# Patient Record
Sex: Male | Born: 1945 | Race: White | Hispanic: No | Marital: Married | Smoking: Never smoker
Health system: Southern US, Community
[De-identification: ages and names within clinical notes are randomized; demographics above are authoritative.]

## PROBLEM LIST (undated history)

## (undated) DIAGNOSIS — E785 Hyperlipidemia, unspecified: Secondary | ICD-10-CM

## (undated) DIAGNOSIS — I10 Essential (primary) hypertension: Secondary | ICD-10-CM

## (undated) HISTORY — PX: COLONOSCOPY: SHX174

## (undated) HISTORY — PX: CATARACT EXTRACTION: SUR2

## (undated) HISTORY — DX: Hyperlipidemia, unspecified: E78.5

## (undated) HISTORY — DX: Essential (primary) hypertension: I10

## (undated) HISTORY — PX: OTHER SURGICAL HISTORY: SHX169

---

## 1998-07-05 ENCOUNTER — Ambulatory Visit (HOSPITAL_COMMUNITY): Admission: RE | Admit: 1998-07-05 | Discharge: 1998-07-05 | Payer: Self-pay | Admitting: Specialist

## 1998-10-11 ENCOUNTER — Ambulatory Visit (HOSPITAL_COMMUNITY): Admission: RE | Admit: 1998-10-11 | Discharge: 1998-10-11 | Payer: Self-pay | Admitting: Gastroenterology

## 2004-03-02 ENCOUNTER — Encounter: Payer: Self-pay | Admitting: Internal Medicine

## 2006-02-05 ENCOUNTER — Ambulatory Visit: Payer: Self-pay | Admitting: Internal Medicine

## 2006-02-21 ENCOUNTER — Ambulatory Visit: Payer: Self-pay | Admitting: Internal Medicine

## 2009-07-06 ENCOUNTER — Ambulatory Visit: Payer: Self-pay | Admitting: Internal Medicine

## 2009-07-06 ENCOUNTER — Telehealth (INDEPENDENT_AMBULATORY_CARE_PROVIDER_SITE_OTHER): Payer: Self-pay | Admitting: *Deleted

## 2009-07-06 DIAGNOSIS — E785 Hyperlipidemia, unspecified: Secondary | ICD-10-CM | POA: Insufficient documentation

## 2009-07-06 DIAGNOSIS — I10 Essential (primary) hypertension: Secondary | ICD-10-CM | POA: Insufficient documentation

## 2009-07-06 DIAGNOSIS — R739 Hyperglycemia, unspecified: Secondary | ICD-10-CM | POA: Insufficient documentation

## 2009-07-06 DIAGNOSIS — G4730 Sleep apnea, unspecified: Secondary | ICD-10-CM | POA: Insufficient documentation

## 2009-07-12 ENCOUNTER — Encounter: Payer: Self-pay | Admitting: Internal Medicine

## 2010-06-19 ENCOUNTER — Other Ambulatory Visit: Payer: Self-pay | Admitting: Internal Medicine

## 2010-06-19 ENCOUNTER — Encounter: Payer: Self-pay | Admitting: Internal Medicine

## 2010-06-19 ENCOUNTER — Ambulatory Visit
Admission: RE | Admit: 2010-06-19 | Discharge: 2010-06-19 | Payer: Self-pay | Source: Home / Self Care | Attending: Internal Medicine | Admitting: Internal Medicine

## 2010-06-19 DIAGNOSIS — K219 Gastro-esophageal reflux disease without esophagitis: Secondary | ICD-10-CM | POA: Insufficient documentation

## 2010-06-19 LAB — BASIC METABOLIC PANEL
BUN: 19 mg/dL (ref 6–23)
CO2: 28 mEq/L (ref 19–32)
Calcium: 9.3 mg/dL (ref 8.4–10.5)
Chloride: 106 mEq/L (ref 96–112)
Creatinine, Ser: 1.1 mg/dL (ref 0.4–1.5)
GFR: 68.77 mL/min (ref >60.00)
Glucose, Bld: 110 mg/dL — ABNORMAL HIGH (ref 70–99)
Potassium: 4.5 mEq/L (ref 3.5–5.1)
Sodium: 141 mEq/L (ref 135–145)

## 2010-06-19 LAB — HEPATIC FUNCTION PANEL
ALT: 28 U/L (ref 0–53)
AST: 26 U/L (ref 0–37)
Albumin: 4.1 g/dL (ref 3.5–5.2)
Alkaline Phosphatase: 61 U/L (ref 39–117)
Bilirubin, Direct: 0.1 mg/dL (ref 0.0–0.3)
Total Bilirubin: 0.7 mg/dL (ref 0.3–1.2)
Total Protein: 7.2 g/dL (ref 6.0–8.3)

## 2010-06-19 LAB — CONVERTED CEMR LAB
Cholesterol, target level: 200 mg/dL
HDL goal, serum: 40 mg/dL
LDL Goal: 130 mg/dL

## 2010-06-19 LAB — PSA: PSA: 1.91 ng/mL (ref 0.10–4.00)

## 2010-06-21 ENCOUNTER — Telehealth: Payer: Self-pay | Admitting: Internal Medicine

## 2010-07-10 NOTE — Progress Notes (Signed)
Summary: Order Request  Phone Note Call from Patient Call back at Home Phone [phone]   Caller: Patient Summary of Call: Patient called to say he called [facility] and they need an order for him to get a CPAP.  Called [facility] and spoke with Marisue Humble and she said the will need: Order for Home sleep study first and based on that we will see if he qualifies for any medical equipment(CPAP)  Order placed and faxed./Chrae [facility]  July 06, 2009 11:45 AM

## 2010-07-10 NOTE — Assessment & Plan Note (Signed)
Summary: cpx/alr   Vital Signs:  Patient profile:   65 year old male Height:      69.5 inches Weight:      183.6 pounds BMI:     26.82 Temp:     98.4 degrees F oral Pulse rate:   64 / minute Resp:     14 per minute BP sitting:   138 / 70  (left arm) Cuff size:   large  Vitals Entered By: Shonna Chock (July 06, 2009 8:41 AM) CC: CPX: not fasting, refused EKG, Hypertension Management Comments REVIEWED MED LIST, PATIENT AGREED DOSE AND INSTRUCTION CORRECT    CC:  CPX: not fasting, refused EKG, and Hypertension Management.  History of Present Illness: Jason Key is here for med refills & to discuss possible sleep apnea. His wife has noted apnea & CPAP titration has been effective when tolerated. He has difficulty sleeping supine & "got tangled " in tubing. Survelliance labs @ [facility] 08/2008 (PSA included)  revealed FBS 108 & LDL < 100. Creatinine 1.3-1.4 range.A1c was 5.8% last month  Hypertension History:      He complains of visual symptoms, but denies headache, chest pain, palpitations, dyspnea with exertion, orthopnea, PND, peripheral edema, neurologic problems, syncope, and side effects from treatment.  BP averages 110-120/70s.        Positive major cardiovascular risk factors include male age 37 years old or older, hyperlipidemia, and hypertension.  Negative major cardiovascular risk factors include non-tobacco-user status.     Preventive Screening-Counseling & Management  Alcohol-Tobacco     Smoking Status: never  Caffeine-Diet-Exercise     Does Patient Exercise: yes  Allergies (verified): 1)  ! 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 Eyes:  Floaters monitored by Jason Hazle Quant. ENT:  Denies difficulty swallowing and nasal congestion. CV:  Denies leg cramps with exertion. Resp:  Complains of excessive snoring; denies hypersomnolence and morning headaches. GI:  Complains of indigestion; denies abdominal pain, bloody stools, and dark tarry stools; Some increased dyspepsia, PPI as needed . GU:  Denies nocturia and urinary hesitancy. Derm:  Denies poor wound healing; Seborrheic keratosis scalp lesion . Neuro:  Denies numbness and tingling. Endo:  Denies cold intolerance, excessive hunger, excessive thirst, excessive urination, and heat intolerance.  Physical Exam  General:  well-nourished,in no acute distress; alert,appropriate and cooperative throughout examination Head:  Normocephalic and atraumatic without obvious abnormalities. 3X4 mm ? seborrheic keratosis vs epidermoid inclusion cyst Mouth:  Oral mucosa and oropharynx without lesions or exudates.  Teeth in good repair. Mucocoele of tongue R of midline Neck:  No deformities, masses, or tenderness noted. Lungs:  Normal respiratory effort, chest expands symmetrically. Lungs are clear to auscultation, no crackles or wheezes. Heart:  Normal rate and regular rhythm. S1 and S2 normal without gallop, murmur, click, rub .S4 Abdomen:  Bowel sounds positive,abdomen soft and non-tender without masses, organomegaly or hernias noted. Msk:  No deformity or scoliosis noted of thoracic or lumbar spine.   Pulses:  R and L carotid,radial,dorsalis pedis and posterior tibial pulses are full and equal bilaterally  Extremities:  No clubbing, cyanosis, edema, or deformity noted with normal full range of motion of all joints.   Neurologic:  alert & oriented X3 and DTRs symmetrical and normal.   Skin:  see tongue &  scalp Cervical Nodes:  No lymphadenopathy noted Axillary Nodes:  No palpable lymphadenopathy Psych:  memory intact for recent and remote, normally interactive, and good eye contact.     Impression & Recommendations:  Problem # 1:  HYPERTENSION (ICD-401.9) controlled His updated medication list for this problem includes:    Benicar 20 Mg Tabs (Olmesartan medoxomil) .Marland Kitchen... 1 by mouth once daily  Problem # 2:  HYPERLIPIDEMIA (ICD-272.4)  His updated medication list for this problem includes:    Crestor 10 Mg Tabs (Rosuvastatin calcium) .Marland Kitchen... 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 1 by mouth once daily 2)  Crestor 10 Mg Tabs (Rosuvastatin calcium) .Marland Kitchen.. 1 by mouth at bedtime 3)  Meloxicam 7.5 Mg Tabs (Meloxicam) .Marland Kitchen.. 1 two times a day as needed  Hypertension Assessment/Plan:      The patient's hypertensive risk group is category B: At least one risk factor (excluding diabetes) with no target organ damage.  Today's blood pressure is 138/70.    Patient Instructions: 1)  PSA @ [facility] Lab screen in 08/2009 please. 2)  Schedule a colonoscopy as per [facility] Prescriptions: MELOXICAM 7.5 MG TABS (MELOXICAM) 1 two times a day as needed  #90 x 1   Entered and Authorized by:   Marga Melnick MD   Signed by:   Marga Melnick MD on 07/06/2009   Method used:   Print then Give to Patient   RxID:   [id number] CRESTOR 10 MG TABS (ROSUVASTATIN CALCIUM) 1 by mouth at bedtime  #90 x 3   Entered and Authorized by:   Marga Melnick MD   Signed by:   Marga Melnick MD on 07/06/2009   Method used:   Print then Give to Patient   RxID:   [id number] BENICAR 20 MG TABS (OLMESARTAN MEDOXOMIL) 1 by mouth once daily  #90 x 3   Entered and Authorized by:   Marga Melnick MD   Signed by:   Marga Melnick MD on 07/06/2009   Method used:    Print then Give to Patient   RxID:   [id number]

## 2010-07-12 NOTE — Assessment & Plan Note (Signed)
Summary: 6 month roa//pt will be fasting//lch   Vital Signs:  Patient profile:   65 year old male Height:      69.5 inches Weight:      184.8 pounds BMI:     27.00 Temp:     98.5 degrees F oral Pulse rate:   84 / minute Resp:     14 per minute BP sitting:   130 / 82  (left arm) Cuff size:   large  Vitals Entered By: Shonna Chock CMA (June 19, 2010 8:03 AM) CC: follow-up visit, Heartburn, Lipid Management   CC:  follow-up visit, Heartburn, and Lipid Management.  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                                  Hyperlipidemia Follow-Up:   The patient denies muscle aches, GI upset, abdominal pain, flushing, itching, constipation, diarrhea, and fatigue.  The patient denies the following symptoms: chest pain/pressure, exercise intolerance, dypsnea, palpitations, syncope, and pedal edema.  Compliance with medications ( Crestor 10 mg qod) has been near 100%.  Dietary compliance has been poor.  The patient reports exercising 2-3 X per week.  Adjunctive measures currently used by the patient include ASA and fish oil supplements.      Hypertension Follow-Up:  The patient denies lightheadedness, urinary frequency, and headaches.  Compliance with medications (by patient report) has been near 100%.  Adjunctive measures currently used by the patient include salt restriction.  BP 110-120/< 80.     Heartburn:  The patient reports acid reflux and weight gain of 5#, but denies sour taste in mouth, epigastric pain, and trouble swallowing.  The patient denies the following alarm features: melena, dysphagia, and hematemesis.  Symptoms are worse with alcohol and spicy foods.  The patient has found the following treatments to be effective: a PPI.  No EGD to date;colonoscopy due.  Lipid Management History:      Positive NCEP/ATP III risk factors include male age 61 years old or older and hypertension.  Negative NCEP/ATP  III risk factors include non-diabetic, no family history for ischemic heart disease, non-tobacco-user status, no ASHD (atherosclerotic heart disease), no prior stroke/TIA, no peripheral vascular disease, and no history of aortic aneurysm.     Current Medications (verified): 1)  Benicar 40 Mg Tabs (Olmesartan Medoxomil) .... Daily As Directed 2)  Crestor 20 Mg Tabs (Rosuvastatin Calcium) .... 1/2 By Mouth Daily As Directed 3)  Meloxicam 7.5 Mg Tabs (Meloxicam) .Marland Kitchen.. 1 Two Times A Day As Needed  Allergies: 1)  ! Sulfa  Past History:  Past Medical History: Sleep Apnea, CPAP Rxed Hyperlipidemia: NMR Lipoprofile : 098(1191/478), HDL 38,TG 65. LDL goal= <130, ideally < 295. Framingham Study LDL goal = < 130. 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 No apparent alopecia or balding. Eyes:  OD WNL; prostheis OS Ears:  External ear exam shows no significant lesions or deformities.  Otoscopic  examination reveals clear canals, tympanic membranes are intact bilaterally without bulging, retraction, inflammation or discharge. Hearing is grossly normal bilaterally. Nose:  External nasal examination shows no deformity or inflammation. Nasal mucosa are pink and moist without lesions or exudates. Slight seotal dislocation Mouth:  Oral mucosa and oropharynx without lesions or exudates.  Teeth in good repair. Minimal uvular  erythema.   Neck:  No deformities, masses, or tenderness noted. Lungs:   Normal respiratory effort, chest expands symmetrically. Lungs are clear to auscultation, no crackles or wheezes. Heart:  Normal rate and regular rhythm. S1 and S2 normal without gallop, murmur, click, rub .S4 Abdomen:  Bowel sounds positive,abdomen soft and non-tender without masses, organomegaly or hernias noted. Rectal:  declined Genitalia:  self exams Prostate:  declined Pulses:  R and L carotid,radial,dorsalis pedis and posterior tibial pulses are full and equal bilaterally Extremities:  No clubbing, cyanosis, edema, or deformity noted with normal full range of motion of all joints.   Neurologic:  alert & oriented X3 and DTRs symmetrical and normal.   Skin:  Intact without suspicious lesions or rashes Cervical Nodes:  No lymphadenopathy noted Axillary Nodes:  No palpable lymphadenopathy Psych:  memory intact for recent and remote, normally interactive, and good eye contact.     Impression & Recommendations:  Problem # 1:  PREVENTIVE HEALTH CARE (ICD-V70.0)  Orders: T- * Misc. Laboratory test [phone]) TLB-Hepatic/Liver Function Pnl (80076-HEPATIC) TLB-BMP (Basic Metabolic Panel-BMET) (80048-METABOL) TLB-PSA (Prostate Specific Antigen) (84153-PSA)  Problem # 2:  HYPERTENSION (ICD-401.9) controlled  Problem # 3:  HYPERLIPIDEMIA (ICD-272.4)  His updated medication list for this problem includes:    Crestor 20 Mg Tabs (Rosuvastatin calcium) .Marland Kitchen... 1/2 by mouth daily as directed  Problem # 4:  GERD (ICD-530.81)  His updated medication list for this problem includes:    Ranitidine Hcl 150 Mg Caps (Ranitidine hcl) .Marland Kitchen... 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 Daily as directed 2)  Crestor 20 Mg Tabs (Rosuvastatin calcium) .... 1/2 by mouth daily as directed 3)  Ranitidine Hcl 150 Mg Caps (Ranitidine hcl) .Marland Kitchen.. 1 two times a day pre meals  Lipid Assessment/Plan:       Based on NCEP/ATP III, the patient's risk factor category is "0-1 risk factors".  The patient's lipid goals are as follows: Total cholesterol goal is 200; LDL cholesterol goal is 130; HDL cholesterol goal is 40; Triglyceride goal is 150.    Patient Instructions: 1)  Schedule a colonoscopy  to help detect colon cancer as per  [facility]. 2)  It is important that you exercise regularly at least 20 minutes 5 times a week. If you develop chest pain, have severe difficulty breathing, or feel very tired , stop exercising immediately and seek medical attention. 3)  Take an  81 mg coated Aspirin every day. 4)  Check your Blood Pressure regularly. If it is above: 135/85 ON AVERAGE  you should make an appointment. Prescriptions: RANITIDINE HCL 150 MG CAPS (RANITIDINE HCL) 1 two times a day pre meals  #180 x 3   Entered and Authorized by:   Marga Melnick MD   Signed by:   Marga Melnick MD on 06/19/2010   Method used:   Print then Give to Patient   RxID:   [id number] CRESTOR 20 MG TABS (ROSUVASTATIN CALCIUM) 1/2 by mouth daily as directed  #30 x 5   Entered and  Authorized by:   Marga Melnick MD   Signed by:   Marga Melnick MD on 06/19/2010   Method used:   Print then Give to Patient   RxID:   [id number] BENICAR 40 MG TABS (OLMESARTAN MEDOXOMIL) daily as directed  #90 x 3   Entered and Authorized by:   Marga Melnick MD   Signed by:   Marga Melnick MD on 06/19/2010   Method used:   Print then Give to Patient   RxID:   [id number]    Orders Added: 1)  Est. Patient 40-64 years [99396] 2)  T- * Misc. Laboratory test [99999] 3)  TLB-Hepatic/Liver Function Pnl [80076-HEPATIC] 4)  TLB-BMP (Basic Metabolic Panel-BMET) [80048-METABOL] 5)  TLB-PSA (Prostate Specific Antigen) [66440-HKV]

## 2010-07-12 NOTE — Progress Notes (Signed)
Summary: Crestor rx  Phone Note Call from Patient Call back at Home Phone [phone]   Summary of Call: Patient left message on triage noting that he was given Crestor 20mg  #30. The insurance company will only pay for 30 or 90 day supply. Patient would like #45 faxed to [facility] at [phone].  Patient notified this was done. Initial call taken by: Lucious Groves CMA,  June 21, 2010 10:08 AM    Prescriptions: CRESTOR 20 MG TABS (ROSUVASTATIN CALCIUM) 1/2 by mouth daily as directed  #45 x 2   Entered by:   Lucious Groves CMA   Authorized by:   Marga Melnick MD   Signed by:   Lucious Groves CMA on 06/21/2010   Method used:   Electronically to        [facility]* (retail)       [address]       [place], [place]  [address]       Ph: [phone]       Fax: [phone]   RxID:   [id number]

## 2010-07-17 ENCOUNTER — Telehealth (INDEPENDENT_AMBULATORY_CARE_PROVIDER_SITE_OTHER): Payer: Self-pay | Admitting: *Deleted

## 2010-07-26 NOTE — Progress Notes (Signed)
Summary: [facility]  Phone Note Call from Patient Call back at [facility] Phone [phone]   Summary of Call: Pt called stating that he needed to discuss w/ Dr.Hopper his [facility]. Please advise. Army Fossa CMA  July 17, 2010 8:59 AM   Follow-up for Phone Call        Patient called again because he had not heard back and I informed him an OV would be a faster way to address [facility] lab. Patient then indicated his concern is he got a [employer] and the insurance has already paid their part and the 2000 dollars was dedutced from his healthcare savings and he needed that money for other things.  I informed the patient I will Contact Stan ([facility]) and inform him of this situation and either me or Weyman Croon will contact him back. Patient left cell number [phone] Follow-up by: Shonna Chock CMA,  July 18, 2010 9:23 AM  Additional Follow-up for Phone Call Additional follow up Details #1::        Weyman Croon called me back and left message on voicemail informing me he has handled the situation. Additional Follow-up by: Shonna Chock CMA,  July 18, 2010 1:04 PM

## 2011-07-08 ENCOUNTER — Ambulatory Visit (INDEPENDENT_AMBULATORY_CARE_PROVIDER_SITE_OTHER): Payer: BC Managed Care – PPO | Admitting: Internal Medicine

## 2011-07-08 ENCOUNTER — Encounter: Payer: Self-pay | Admitting: Internal Medicine

## 2011-07-08 VITALS — BP 120/74 | HR 63 | Temp 98.2°F | Resp 12 | Ht 69.0 in | Wt 181.0 lb

## 2011-07-08 DIAGNOSIS — E785 Hyperlipidemia, unspecified: Secondary | ICD-10-CM

## 2011-07-08 MED ORDER — ROSUVASTATIN CALCIUM 10 MG PO TABS: ORAL_TABLET | ORAL | Status: DC

## 2011-07-08 MED ORDER — OLMESARTAN MEDOXOMIL 20 MG PO TABS: 20.0000 mg | ORAL_TABLET | Freq: Every day | ORAL | Status: DC

## 2011-07-08 NOTE — Progress Notes (Signed)
Subjective:    Patient ID: Jason Key, male    DOB: 08-10-1945, 66 y.o.   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 Alert, appropriate and cooperative throughout exam. Head: Normocephalic without obvious abnormalities  Eyes: No corneal or conjunctival inflammation noted of non prosthetic eye.  Ears: External  ear exam reveals no significant lesions or deformities. Canals clear .TMs normal. Hearing is grossly normal bilaterally. Nose: External nasal exam reveals no deformity or inflammation. Nasal mucosa are pink and moist. No lesions or exudates noted.   Mouth: Oral mucosa and oropharynx reveal no lesions or exudates. Teeth in good repair. Neck: No deformities, masses, or tenderness noted. Range of motion &  Thyroid normal Lungs: Normal respiratory effort; chest expands symmetrically. Lungs are clear to auscultation without rales, wheezes, or increased work of breathing. Heart: Normal rate and rhythm. Normal S1 and S2. No gallop, click, or rub. S 4 w/o  murmur. Abdomen: Bowel sounds normal; abdomen soft and nontender. No masses, organomegaly or hernias noted. Genitalia/DRE:  Genital exam is unremarkable. The left lobe of the of the prostate is normal; there is mild enlargement of the right. There is no induration or nodularity present.                                                                                Musculoskeletal/extremities: No deformity or scoliosis noted of  the thoracic or lumbar spine. No clubbing, cyanosis, edema, or deformity noted. Range of motion  normal .Tone & strength  normal.Joints normal. Nail health  good. Vascular: Carotid, radial artery, dorsalis pedis and  posterior tibial pulses are full and equal. No bruits present. Neurologic: Alert and oriented x3. Deep tendon reflexes symmetrical and normal.          Skin: Intact without suspicious lesions or rashes. Lymph: No cervical, axillary, or inguinal lymphadenopathy present. Psych: Mood and affect are normal. Normally interactive                                                                                         Assessment & Plan:  #1 comprehensive physical exam; no acute findings #2 see Problem List with Assessments & Recommendations  #3 he has  an apparent  viral upper respiratory infection; options discussed  #4 mild asymmetric, asymptomatic prostatic hypertrophy. He will consider whether to monitor PSA. Plan: see Orders

## 2011-07-08 NOTE — Patient Instructions (Signed)
Preventive Health Care: Exercise at least 30-45 minutes a day,  3-4 days a week.  Eat a low-fat diet with lots of fruits and vegetables, up to 7-9 servings per day. Consume less than 40 grams of sugar per day from foods & drinks with High Fructose Corn Sugar as #1,2,3 or # 4 on label. Health Care Power of Attorney & Living Will. Complete if not in place ; these place you in charge of your health care decisions.  Please  schedule fasting Labs : BMET,Lipids, hepatic panel, CBC & dif, TSH, PSA. PLEASE BRING THESE INSTRUCTIONS TO FOLLOW UP  LAB APPOINTMENT.This will guarantee correct labs are drawn, eliminating need for repeat blood sampling ( needle sticks ! ). Diagnoses /Codes:V70.0 

## 2011-07-09 ENCOUNTER — Other Ambulatory Visit: Payer: Self-pay | Admitting: Internal Medicine

## 2011-07-09 DIAGNOSIS — Z0000 Encounter for general adult medical examination without abnormal findings: Secondary | ICD-10-CM

## 2011-07-10 ENCOUNTER — Other Ambulatory Visit (INDEPENDENT_AMBULATORY_CARE_PROVIDER_SITE_OTHER): Payer: BC Managed Care – PPO

## 2011-07-10 DIAGNOSIS — Z0000 Encounter for general adult medical examination without abnormal findings: Secondary | ICD-10-CM

## 2011-07-10 LAB — LIPID PANEL
HDL: 39.9 mg/dL (ref >39.00)
LDL Cholesterol: 86 mg/dL (ref 0–99)
Total CHOL/HDL Ratio: 3
Triglycerides: 62 mg/dL (ref 0.0–149.0)

## 2011-07-10 LAB — COMPREHENSIVE METABOLIC PANEL
AST: 25 U/L (ref 0–37)
Alkaline Phosphatase: 51 U/L (ref 39–117)
BUN: 24 mg/dL — ABNORMAL HIGH (ref 6–23)
Calcium: 9.1 mg/dL (ref 8.4–10.5)
Creatinine, Ser: 1.3 mg/dL (ref 0.4–1.5)
Total Bilirubin: 0.4 mg/dL (ref 0.3–1.2)

## 2011-10-23 ENCOUNTER — Encounter: Payer: Self-pay | Admitting: Internal Medicine

## 2011-11-26 ENCOUNTER — Ambulatory Visit (INDEPENDENT_AMBULATORY_CARE_PROVIDER_SITE_OTHER): Payer: BC Managed Care – PPO | Admitting: Sports Medicine

## 2011-11-26 ENCOUNTER — Encounter: Payer: Self-pay | Admitting: Sports Medicine

## 2011-11-26 VITALS — BP 150/94 | HR 66 | Ht 69.0 in | Wt 170.0 lb

## 2011-11-26 DIAGNOSIS — M5412 Radiculopathy, cervical region: Secondary | ICD-10-CM

## 2011-11-26 MED ORDER — PREDNISONE 20 MG PO TABS: 20.0000 mg | ORAL_TABLET | Freq: Three times a day (TID) | ORAL | Status: AC

## 2011-11-26 NOTE — Progress Notes (Signed)
  Subjective:    Patient ID: Jason Key, male    DOB: 10/08/1945, 66 y.o.   MRN: [id number]  HPI  Pt presents to clinic for evaluation of rt medial scapular pain, medial right elbow pain, and numbness to fingers 3-5 on rt hand. He has had a history of some periodic pain in his periscapular area for some years He has not had known disc disease before He thinks this started after doing fairly vigorous workouts in his metabolic activity routine  He realizes now that when his arm hangs down or gets traction on the neck this triggers more of the pain There is not much pain over the muscles themselves or the elbow joint itself In trying to play piano he realized that his right hand was more clumsy than usual and he is right-handed  Review of Systems     Objective:   Physical Exam No acute distress No pain with neck extension  Rt lateral neck bend had pain in rt elbow Spurling's positive on the right Weaknesss on flexion of 5 th digit right Weak on inner osseous squeeze right hand  No pain with abduction and elevation of arms bilat Scapular movement is normal bilat C5-6 testing normal Extension of elbow slightly weak Triceps stronger on Lt than rt Biceps reflexes normal bilat Triceps reflex normal           Assessment & Plan:

## 2011-11-26 NOTE — Patient Instructions (Signed)
Avoid weight lifting for the next 2 weeks  Avoid overhead lifting or anything that jars neck  Try stationary bike or treadmill for cardio exercise  Do easy ROM with neck a few times per day  Heat may help discomfort more than cold  Please follow up in 2 weeks if you are not improved  Thank you for seeing [place] today!

## 2011-11-26 NOTE — Assessment & Plan Note (Signed)
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

## 2011-12-11 ENCOUNTER — Other Ambulatory Visit: Payer: Self-pay | Admitting: *Deleted

## 2011-12-11 MED ORDER — TRAMADOL HCL 50 MG PO TABS: ORAL_TABLET | ORAL | Status: DC

## 2011-12-11 MED ORDER — GABAPENTIN 300 MG PO CAPS: 300.0000 mg | ORAL_CAPSULE | Freq: Three times a day (TID) | ORAL | Status: DC

## 2012-01-03 ENCOUNTER — Telehealth: Payer: Self-pay | Admitting: Internal Medicine

## 2012-01-03 MED ORDER — LOSARTAN POTASSIUM 100 MG PO TABS: 100.0000 mg | ORAL_TABLET | Freq: Every day | ORAL | Status: DC

## 2012-01-03 NOTE — Telephone Encounter (Signed)
Rx sent 

## 2012-01-03 NOTE — Telephone Encounter (Signed)
Pt states Benicar is too expensive. I spoke with Dr. Alwyn Ren and he gave a verbal to change to Losartan 100mg  1x day. Pt uses [facility] on [address]

## 2012-04-02 ENCOUNTER — Other Ambulatory Visit: Payer: Self-pay | Admitting: Internal Medicine

## 2012-04-02 NOTE — Telephone Encounter (Signed)
Spoke with patient, patient stated Dr.Hopper rx'ed medication. Patient states he takes it infrequently.  Dr.Hopper please advise since medication is not on medication list

## 2012-04-02 NOTE — Telephone Encounter (Signed)
#   180  , bid prn

## 2012-04-02 NOTE — Telephone Encounter (Signed)
Left message on voicemail for patient to return call when available, reason for call: medication requested is not on med list.

## 2012-04-28 ENCOUNTER — Other Ambulatory Visit: Payer: Self-pay | Admitting: Internal Medicine

## 2012-04-28 ENCOUNTER — Ambulatory Visit (INDEPENDENT_AMBULATORY_CARE_PROVIDER_SITE_OTHER): Payer: BC Managed Care – PPO | Admitting: Sports Medicine

## 2012-04-28 VITALS — BP 140/70 | Ht 69.0 in | Wt 167.0 lb

## 2012-04-28 DIAGNOSIS — M720 Palmar fascial fibromatosis [Dupuytren]: Secondary | ICD-10-CM | POA: Insufficient documentation

## 2012-04-28 NOTE — Progress Notes (Signed)
Subjective:     Patient ID: Jason Key, male   DOB: [birth year], 66 y.o.   MRN: [id number]  HPI 66 yo M established patient presents for follow up visit to discuss a new problem:   1. Nodule on the volar aspect of right hand proximal to metacarpal head. Non tender. Associated with flexion of finger in AM and moderate pain. Finger flexion is reduced with stretching.   2. Nodule L foot on the medial plantar surface. Non tender.   Family History: positive for similar nodules in his mother  Review of Systems As per HPI     Objective:   Physical Exam BP 140/70  Ht 5\' 9"  (1.753 m)  Wt 167 lb (75.751 kg)  BMI 24.66 kg/m2 General appearance: alert, cooperative and no distress Resp: normal work of breathing MSK: R hand: Small palpable nodule right palm immediately proximal to metacarpal head. Non tender. Full ROM in digit. Full strength.  Note left hand has similar changes just less enlarged L foot: two palpable, non tender nodules medial plantar foot. Largest 1.5x1 cm.      Assessment:

## 2012-04-28 NOTE — Assessment & Plan Note (Addendum)
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  If foot nodule becomes painful, cut out the underlying area of shoe insole to support the nodule while removing pressure.   We can try injection therapy if they become entrapped or if too painful  Reck this prn

## 2012-07-31 ENCOUNTER — Other Ambulatory Visit: Payer: Self-pay | Admitting: Internal Medicine

## 2012-11-09 ENCOUNTER — Other Ambulatory Visit: Payer: Self-pay | Admitting: Internal Medicine

## 2012-11-20 ENCOUNTER — Encounter: Payer: Self-pay | Admitting: Internal Medicine

## 2012-11-20 ENCOUNTER — Ambulatory Visit (INDEPENDENT_AMBULATORY_CARE_PROVIDER_SITE_OTHER): Payer: BC Managed Care – PPO | Admitting: Internal Medicine

## 2012-11-20 VITALS — BP 118/78 | HR 67 | Wt 179.0 lb

## 2012-11-20 DIAGNOSIS — Z0000 Encounter for general adult medical examination without abnormal findings: Secondary | ICD-10-CM

## 2012-11-20 DIAGNOSIS — E785 Hyperlipidemia, unspecified: Secondary | ICD-10-CM

## 2012-11-20 MED ORDER — ATORVASTATIN CALCIUM 20 MG PO TABS: ORAL_TABLET | ORAL | Status: DC

## 2012-11-20 MED ORDER — RANITIDINE HCL 150 MG PO TABS: 150.0000 mg | ORAL_TABLET | Freq: Two times a day (BID) | ORAL | Status: DC

## 2012-11-20 MED ORDER — LOSARTAN POTASSIUM 100 MG PO TABS: ORAL_TABLET | ORAL | Status: DC

## 2012-11-20 NOTE — Assessment & Plan Note (Signed)
He will start generic atorvastatin 20 mg one half pill 2-3 times per week. Followup lipids would be recommended after 3-4 months of this regimen.

## 2012-11-20 NOTE — Patient Instructions (Addendum)
Health Care Power of Attorney & Living Will. Complete these if not in place ; these place you in charge of your health care decisions. Please  schedule fasting Labs in 12 weeks after med changes: Lipids, hepatic panel. PLEASE BRING THESE INSTRUCTIONS TO FOLLOW UP  LAB APPOINTMENT.This will guarantee correct labs are drawn, eliminating need for repeat blood sampling ( needle sticks ! ). Diagnoses /Codes: 272.4,995.20.   If you activate the  My Chart system; lab & Xray results will be released directly  to you as soon as I review & address these through the computer. If you choose not to sign up for My Chart within 36 hours of labs being drawn; results will be reviewed & interpretation added before being copied & mailed, causing a delay in getting the results to you.If you do not receive that report within 7-10 days ,please call. Additionally you can use this system to gain direct  access to your records  if  out of town or @ an office of a  physician who is not in  the My Chart network.  This improves continuity of care & places you in control of your medical record.

## 2012-11-20 NOTE — Progress Notes (Signed)
  Subjective:    Patient ID: Jason Key, male    DOB: 1946-05-31, 66 y.o.   MRN: [id number]  HPI [name] is here for a physical & med refills.    Review of Systems He is on a heart healthy diet; he exercises 60 minutes 2-3 times per week without symptoms. Specifically he denies chest pain, palpitations, dyspnea, or claudication. Family history is negative for premature coronary disease. Advanced cholesterol testing reveals his LDL goal is less than 125, ideally < 95. Labs done 09/03/12 were reviewed.  LDL is excellent at 105; HDL 52. GFR is minimally reduced at 58. PSA is 2.3 on; serial PSAs were reviewed. There's been no significant increase in the velocity of rise. BP averages 110-120/< 80 @ home.     Objective:   Physical Exam Gen.: Healthy and well-nourished in appearance. Alert, appropriate and cooperative throughout exam. Head: Normocephalic without obvious abnormalities  Eyes: No corneal or conjunctival inflammation noted. Prosthesis OS. Ears: External  ear exam reveals no significant lesions or deformities. Canals clear .TMs normal. Hearing is grossly normal bilaterally. Nose: External nasal exam reveals no deformity or inflammation. Nasal mucosa are pink and moist. No lesions or exudates noted.   Mouth: Oral mucosa and oropharynx reveal no lesions or exudates. Teeth in good repair. Neck: No deformities, masses, or tenderness noted. Range of motion normal. Thyroid small w/o nodules. Lungs: Normal respiratory effort; chest expands symmetrically. Lungs are clear to auscultation without rales, wheezes, or increased work of breathing. Heart: Normal rate and rhythm. Normal S1 and S2. No gallop, click, or rub. S4 w/o murmur. Abdomen: Bowel sounds normal; abdomen soft and nontender. No masses, organomegaly or hernias noted. Prostate not done; colonoscopy in 2013  Musculoskeletal/extremities: There is minor asymmetry of the lower thoracic musculature suggesting occult scoliosis. No  clubbing, cyanosis, edema, or significant extremity  deformity noted. Range of motion normal .Tone & strength  Normal. Crepitus of knees Joints normal otherwise normal. Nail health good. Able to lie down & sit up w/o help. Negative SLR bilaterally Vascular: Carotid, radial artery, dorsalis pedis and  posterior tibial pulses are full and equal. No bruits present. Neurologic: Alert and oriented x3. Deep tendon reflexes symmetrical and normal.      Skin: Intact without suspicious lesions or rashes. Lymph: No cervical, axillary lymphadenopathy present. Psych: Mood and affect are normal. Normally interactive                                                                                       Assessment & Plan:  #1 comprehensive physical exam; no acute findings  Plan: see Orders  & Recommendations

## 2012-11-30 ENCOUNTER — Encounter: Payer: Self-pay | Admitting: Internal Medicine

## 2012-12-21 ENCOUNTER — Telehealth: Payer: Self-pay | Admitting: *Deleted

## 2012-12-21 NOTE — Telephone Encounter (Signed)
Received call regarding patient sleep study in 2010. Results were faxed on Friday for this patient but she states they needed an AHI level and an O2 saturation from this sleep study. Please contact Gina at [phone] ext [phone]

## 2012-12-23 NOTE — Telephone Encounter (Signed)
Called provided number, spoke with Elita Quick. Faxed Home Sleep Study Report from 07/17/2009 (obtained from [facility]) to [phone]

## 2013-05-28 ENCOUNTER — Other Ambulatory Visit: Payer: Self-pay | Admitting: *Deleted

## 2013-05-28 MED ORDER — ATORVASTATIN CALCIUM 20 MG PO TABS: ORAL_TABLET | ORAL | Status: DC

## 2013-06-29 ENCOUNTER — Other Ambulatory Visit: Payer: Self-pay | Admitting: *Deleted

## 2013-06-29 MED ORDER — ATORVASTATIN CALCIUM 20 MG PO TABS: ORAL_TABLET | ORAL | Status: DC

## 2013-08-13 ENCOUNTER — Other Ambulatory Visit: Payer: Self-pay

## 2013-08-13 MED ORDER — ATORVASTATIN CALCIUM 20 MG PO TABS: ORAL_TABLET | ORAL | Status: DC

## 2013-09-21 ENCOUNTER — Ambulatory Visit (INDEPENDENT_AMBULATORY_CARE_PROVIDER_SITE_OTHER): Payer: Managed Care, Other (non HMO) | Admitting: Sports Medicine

## 2013-09-21 ENCOUNTER — Encounter: Payer: Self-pay | Admitting: Sports Medicine

## 2013-09-21 VITALS — BP 147/92 | Ht 69.0 in | Wt 170.0 lb

## 2013-09-21 DIAGNOSIS — M75101 Unspecified rotator cuff tear or rupture of right shoulder, not specified as traumatic: Secondary | ICD-10-CM | POA: Insufficient documentation

## 2013-09-21 DIAGNOSIS — M25519 Pain in unspecified shoulder: Secondary | ICD-10-CM | POA: Insufficient documentation

## 2013-09-21 DIAGNOSIS — S43429A Sprain of unspecified rotator cuff capsule, initial encounter: Secondary | ICD-10-CM

## 2013-09-21 MED ORDER — NITROGLYCERIN 0.2 MG/HR TD PT24: MEDICATED_PATCH | TRANSDERMAL | Status: DC

## 2013-09-21 NOTE — Assessment & Plan Note (Signed)
NTG protocol  RC rehab series  REck 6 wks

## 2013-09-21 NOTE — Assessment & Plan Note (Signed)
We will suggest using Aleve at therapeutic dose  See response to NTG  If pain worsens consider CSI

## 2013-09-21 NOTE — Progress Notes (Signed)
   Subjective:    Patient ID: Jason Key, male    DOB: 01/18/1946, 68 y.o.   MRN: [id number]  HPI  Pt presents to clinic for evaluation of right shoulder pain x 6 months.  Wakes him at night several times. Pain with reaching backwards.  Does metabolic resistance training at [employer]- kettle bells, renegade rows, rope swings  He does most weight lifting exercises without pain Certain lifts or external rotation exercises cause pain in upper RT humerus  Night time pain is significant and only gets some relief with Aleve  Retired physician     Review of Systems     Objective:   Physical Exam NAD BP 147/92  Ht 5\' 9"  (1.753 m)  Wt 170 lb (77.111 kg)  BMI 25.09 kg/m2  RT shoulder Speed's and Yergason's normal Full ROM  Strong ER and IR at 90 degrees, but has pain with ER at 90 deg elevation Positive empty can and hawkins Neer's negative O'Brien's negative Cross over neg  Lt shoulder normal  Neck motion is OK and scapular function normal  US The supraspinatus has a 1.7 cm retracted tear at bursal surface of anterior fibers Mid fibers show a smaller tear - 0.5 cm Post fibers intact  Subscap/ Ter Minor and Infraspin are all normal Biceps norm AC joint normal      Assessment & Plan:

## 2013-09-21 NOTE — Patient Instructions (Signed)
Please do suggested shoulder exercises daily  Start using 1/4 patch of nitroglycerin on right shoulder daily  Nitroglycerin Protocol   Apply 1/4 nitroglycerin patch to affected area daily.  Change position of patch within the affected area every 24 hours.  You may experience a headache during the first 1-2 weeks of using the patch, these should subside.  If you experience headaches after beginning nitroglycerin patch treatment, you may take your preferred over the counter pain reliever.  Another side effect of the nitroglycerin patch is skin irritation or rash related to patch adhesive.  Please notify our office if you develop more severe headaches or rash, and stop the patch.  Tendon healing with nitroglycerin patch may require 12 to 24 weeks depending on the extent of injury.  Men should not use if taking Viagra, Cialis, or Levitra.   Do not use if you have migraines or rosacea.   Please follow up in 6 weeks  Thank you for seeing us today!

## 2013-09-28 ENCOUNTER — Ambulatory Visit: Payer: BC Managed Care – PPO | Admitting: Sports Medicine

## 2013-11-02 ENCOUNTER — Encounter: Payer: Self-pay | Admitting: Sports Medicine

## 2013-11-02 ENCOUNTER — Ambulatory Visit (INDEPENDENT_AMBULATORY_CARE_PROVIDER_SITE_OTHER): Payer: Managed Care, Other (non HMO) | Admitting: Sports Medicine

## 2013-11-02 VITALS — BP 135/89 | Ht 69.0 in | Wt 170.0 lb

## 2013-11-02 DIAGNOSIS — M75101 Unspecified rotator cuff tear or rupture of right shoulder, not specified as traumatic: Secondary | ICD-10-CM

## 2013-11-02 DIAGNOSIS — S43429A Sprain of unspecified rotator cuff capsule, initial encounter: Secondary | ICD-10-CM

## 2013-11-02 NOTE — Assessment & Plan Note (Signed)
This is dramatically improved on exam and [place] Minimal sings of tear noted now Maximum width is < 0.5 cm  Plan keep up NTG protocol for another 6 wks/ OK to stay at half patch  Keep up HEP and gradually increase weight  Reck and repeat scan 6 wks

## 2013-11-02 NOTE — Progress Notes (Signed)
Patient ID: Jason Key, male   DOB: 1945/06/14, 68 y.o.   MRN: [id number]  Patient with a retracted 1.7 cm supraspinatus tear and night pain 5 weeks ago started on NTG and HEP w light weight Able to do exercises w 3 lbs no pain Night pain has resolved over past 2 weeks Back doing multiple lifts with weights but avoiding too aggressive elevation or ER of shoulder  Comes for repeat evaluation  Rest pain is 0  PEXAM Muscular male NAD BP 135/89  Ht 5\' 9"  (1.753 m)  Wt 170 lb (77.111 kg)  BMI 25.09 kg/m2  Full ROM of RT shoulder Strong IR and ER No pain or weakness on Hawkins or empty can No cross over pain Neg Obrien's  [place] The area of previous retraction has almost resolved The supraspinatus shows only minimal hypoechoic change at distal insertion Area of calcification at prior area of tear Subscap/ Infrsp/ TM are all normal Biceps tendon normal

## 2013-11-03 ENCOUNTER — Other Ambulatory Visit: Payer: Managed Care, Other (non HMO) | Admitting: Sports Medicine

## 2013-12-11 ENCOUNTER — Other Ambulatory Visit: Payer: Self-pay | Admitting: Internal Medicine

## 2013-12-15 ENCOUNTER — Other Ambulatory Visit: Payer: Self-pay | Admitting: Internal Medicine

## 2013-12-16 ENCOUNTER — Other Ambulatory Visit: Payer: Managed Care, Other (non HMO) | Admitting: Sports Medicine

## 2013-12-28 ENCOUNTER — Ambulatory Visit (INDEPENDENT_AMBULATORY_CARE_PROVIDER_SITE_OTHER): Payer: Managed Care, Other (non HMO) | Admitting: Sports Medicine

## 2013-12-28 ENCOUNTER — Encounter: Payer: Self-pay | Admitting: Sports Medicine

## 2013-12-28 VITALS — BP 136/81 | HR 64 | Ht 69.0 in | Wt 175.0 lb

## 2013-12-28 DIAGNOSIS — S43429A Sprain of unspecified rotator cuff capsule, initial encounter: Secondary | ICD-10-CM

## 2013-12-28 DIAGNOSIS — M75101 Unspecified rotator cuff tear or rupture of right shoulder, not specified as traumatic: Secondary | ICD-10-CM

## 2013-12-28 MED ORDER — NITROGLYCERIN 0.2 MG/HR TD PT24: MEDICATED_PATCH | TRANSDERMAL | Status: DC

## 2013-12-28 NOTE — Patient Instructions (Signed)

## 2013-12-28 NOTE — Progress Notes (Signed)
  Jacalyn LefevreStephen Hausmann - 68 y.o. male MRN [id number]  Date of birth: 06/11/1945    SUBJECTIVE:     68 year old male presents for followup regarding rotator cuff tear (supraspinatus).   He reports that he is doing well. He states that he does not have pain at rest and only occasionally has pain with certain activities (reaching back, lawn mower pulls, dips, etc). He has now stopped his nitroglycerin patches; he is been off for the past 2 weeks.  He states that he feels like he is 90% improved but not quite normal.    ROS:     Per HPI  PERTINENT  PMH / PSH/ / SH:  Past Medical, Surgical, Social hx Reviewed.   OBJECTIVE: BP 136/81  Pulse 64  Ht 5\' 9"  (1.753 m)  Wt 175 lb (79.379 kg)  BMI 25.83 kg/m2  Physical Exam:  Vital signs are reviewed. General: well appearing gentleman in NAD.  Shoulder: Inspection reveals no abnormalities, atrophy or asymmetry. Palpation is normal with no tenderness over AC joint or bicipital groove. ROM is full in all planes. Rotator cuff strength normal throughout. No signs of impingement with negative Neer and Hawkin's tests, empty can. No labral pathology noted with negative Obrien's, negative clunk and good stability. Normal scapular function observed. No painful arc and no drop arm sign.  MSK US Limited - Right Shoulder Small tear in the supraspinatus (anteriorly) much improved from prior (1.7 cm >> 0.4 cm). Middle and Posterior fibers normal. Area of calcification at prior area of tear  Subscap/ Infrsp/ TM - Normal Biceps tendon normal  ASSESSMENT & PLAN: See problem based charting & AVS for pt instructions.

## 2013-12-28 NOTE — Assessment & Plan Note (Addendum)
Slowly improving. Tear of supraspinatus improving, now 0.4 cm. Continue Nitroglycerin daily to promote blood flow/healing. Keep up home exercise program Avoid any exercises that cause more than mild pain Follow up in ~ 4-6 weeks.

## 2014-01-13 ENCOUNTER — Other Ambulatory Visit: Payer: Self-pay

## 2014-01-13 MED ORDER — RANITIDINE HCL 150 MG PO TABS: 150.0000 mg | ORAL_TABLET | Freq: Two times a day (BID) | ORAL | Status: DC

## 2014-03-14 ENCOUNTER — Other Ambulatory Visit (INDEPENDENT_AMBULATORY_CARE_PROVIDER_SITE_OTHER): Payer: Medicare HMO

## 2014-03-14 ENCOUNTER — Other Ambulatory Visit: Payer: Self-pay | Admitting: Internal Medicine

## 2014-03-14 ENCOUNTER — Ambulatory Visit (INDEPENDENT_AMBULATORY_CARE_PROVIDER_SITE_OTHER): Payer: Medicare HMO | Admitting: Internal Medicine

## 2014-03-14 ENCOUNTER — Encounter: Payer: Self-pay | Admitting: Internal Medicine

## 2014-03-14 VITALS — BP 138/86 | HR 64 | Temp 98.0°F | Resp 13 | Wt 183.1 lb

## 2014-03-14 DIAGNOSIS — R739 Hyperglycemia, unspecified: Secondary | ICD-10-CM

## 2014-03-14 DIAGNOSIS — N429 Disorder of prostate, unspecified: Secondary | ICD-10-CM

## 2014-03-14 DIAGNOSIS — K219 Gastro-esophageal reflux disease without esophagitis: Secondary | ICD-10-CM

## 2014-03-14 DIAGNOSIS — I10 Essential (primary) hypertension: Secondary | ICD-10-CM

## 2014-03-14 DIAGNOSIS — E785 Hyperlipidemia, unspecified: Secondary | ICD-10-CM

## 2014-03-14 LAB — CBC WITH DIFFERENTIAL/PLATELET
BASOS ABS: 0 10*3/uL (ref 0.0–0.1)
BASOS PCT: 0.3 % (ref 0.0–3.0)
Eosinophils Absolute: 0.2 10*3/uL (ref 0.0–0.7)
Eosinophils Relative: 2.4 % (ref 0.0–5.0)
HEMATOCRIT: 44.5 % (ref 39.0–52.0)
HEMOGLOBIN: 14.9 g/dL (ref 13.0–17.0)
Lymphocytes Relative: 31 % (ref 12.0–46.0)
Lymphs Abs: 2.4 10*3/uL (ref 0.7–4.0)
MCHC: 33.5 g/dL (ref 30.0–36.0)
MCV: 91.9 fl (ref 78.0–100.0)
MONOS PCT: 10.9 % (ref 3.0–12.0)
Monocytes Absolute: 0.8 10*3/uL (ref 0.1–1.0)
NEUTROS ABS: 4.2 10*3/uL (ref 1.4–7.7)
Neutrophils Relative %: 55.4 % (ref 43.0–77.0)
Platelets: 224 10*3/uL (ref 150.0–400.0)
RBC: 4.84 Mil/uL (ref 4.22–5.81)
RDW: 13.8 % (ref 11.5–15.5)
WBC: 7.6 10*3/uL (ref 4.0–10.5)

## 2014-03-14 LAB — HEPATIC FUNCTION PANEL
ALT: 24 U/L (ref 0–53)
AST: 23 U/L (ref 0–37)
Albumin: 4.1 g/dL (ref 3.5–5.2)
Alkaline Phosphatase: 75 U/L (ref 39–117)
Bilirubin, Direct: 0.1 mg/dL (ref 0.0–0.3)
TOTAL PROTEIN: 7.3 g/dL (ref 6.0–8.3)
Total Bilirubin: 0.8 mg/dL (ref 0.2–1.2)

## 2014-03-14 LAB — BASIC METABOLIC PANEL
BUN: 19 mg/dL (ref 6–23)
CALCIUM: 9.1 mg/dL (ref 8.4–10.5)
CO2: 29 mEq/L (ref 19–32)
Chloride: 102 mEq/L (ref 96–112)
Creatinine, Ser: 1.2 mg/dL (ref 0.4–1.5)
GFR: 64.07 mL/min (ref >60.00)
GLUCOSE: 100 mg/dL — AB (ref 70–99)
POTASSIUM: 4.4 meq/L (ref 3.5–5.1)
Sodium: 138 mEq/L (ref 135–145)

## 2014-03-14 LAB — PSA: PSA: 11.66 ng/mL — AB (ref 0.10–4.00)

## 2014-03-14 LAB — HEMOGLOBIN A1C: Hgb A1c MFr Bld: 5.9 % (ref 4.6–6.5)

## 2014-03-14 LAB — TSH: TSH: 2.79 u[IU]/mL (ref 0.35–4.50)

## 2014-03-14 MED ORDER — ATORVASTATIN CALCIUM 20 MG PO TABS: ORAL_TABLET | ORAL | Status: DC

## 2014-03-14 MED ORDER — LOSARTAN POTASSIUM 100 MG PO TABS: ORAL_TABLET | ORAL | Status: DC

## 2014-03-14 NOTE — Assessment & Plan Note (Signed)
A1c

## 2014-03-14 NOTE — Assessment & Plan Note (Signed)
Lipoprofile LFTs

## 2014-03-14 NOTE — Assessment & Plan Note (Signed)
PSA

## 2014-03-14 NOTE — Progress Notes (Signed)
   Subjective:    Patient ID: Jason Key, male    DOB: 04/25/1946, 68 y.o.   MRN: [id number]  HPI  He  is here to assess status of active health conditions:  Diet/ nutrition: Heart healthy diet - modified DASH Exercise program: 2-3 times per week, tennis  HYPERTENSION: Disease Monitoring: does not check blood pressure at home Medication Compliance: reports compliance with current therapy.  No adverse effects.   FASTING HYPERGLYCEMIA  :  Does not check blood sugars at home.  Ophthamology care:UTD Podiatry care: none  HYPERLIPIDEMIA: Disease Monitoring: last 08/2012 Medication Compliance: reports compliance with current therapy as Atorvastatin 2 X /week.  No adverse effects.     Review of Systems    Sleep apnea being treated with mask. All below NEGATIVE: Chest pain, palpitations       Dyspnea Edema Claudication  Lightheadedness,Syncope Weight change Polyuria/phagia/dipsia    Blurred vision /diplopia/lossof vision Limb numbness/tingling/burning Non healing skin lesions Abd pain, bowel changes   Myalgias       Objective:   Physical Exam Gen.: Healthy and well-nourished in appearance. Alert, appropriate and cooperative throughout exam.  Head: Normocephalic without obvious abnormalities; pattern alopecia  Eyes: OS prosthesis Ears: External  ear exam reveals no significant lesions or deformities. Canals clear .TMs normal. Hearing is grossly normal bilaterally. Nose: External nasal exam reveals no deformity or inflammation. Nasal mucosa are pink and moist. No lesions or exudates noted.   Mouth: Oral mucosa and oropharynx reveal no lesions or exudates. Teeth in good repair. Neck: No deformities, masses, or tenderness noted. Range of motion &. Thyroid normal Lungs: Normal respiratory effort; chest expands symmetrically. Lungs are clear to auscultation without rales, wheezes, or increased work of breathing. Heart: Normal rate and rhythm. Normal S1 and S2. No gallop, click,  or rub. No murmur. Abdomen: Bowel sounds normal; abdomen soft and nontender. No masses, organomegaly or hernias noted. Genitalia: Genitalia normal except for left varices. Prostate asymmetric; L lobe > R w/o nodularity or induration                                Musculoskeletal/extremities: No deformity or scoliosis noted of  the thoracic or lumbar spine.  No clubbing, cyanosis, edema, or significant extremity  deformity noted. Range of motion normal .Tone & strength normal. Hand joints normal Fingernail  health good. Able to lie down & sit up w/o help. Negative SLR bilaterally Vascular: Carotid, radial artery, dorsalis pedis and  posterior tibial pulses are full and equal. No bruits present. Neurologic: Alert and oriented x3. Deep tendon reflexes symmetrical and normal.  Gait normal.  Skin: Intact without suspicious lesions or rashes. Pattern contact dermatitis from CPAP mask Lymph: No cervical, axillary, or inguinal lymphadenopathy present. Psych: Mood and affect are normal. Normally interactive                                                                                        Assessment & Plan:  See Current Assessment & Plan in Problem List under specific Diagnosis

## 2014-03-14 NOTE — Progress Notes (Signed)
Pre visit review using our clinic review tool, if applicable. No additional management support is needed unless otherwise documented below in the visit note. 

## 2014-03-14 NOTE — Assessment & Plan Note (Signed)
CBC GI referral declined

## 2014-03-14 NOTE — Assessment & Plan Note (Signed)
Blood pressure goals reviewed. BMET 

## 2014-03-14 NOTE — Patient Instructions (Signed)
Reflux of gastric acid may be asymptomatic as this may occur mainly during sleep.The triggers for reflux  include stress; the "aspirin family" ; alcohol; peppermint; and caffeine (coffee, tea, cola, and chocolate). The aspirin family would include aspirin and the nonsteroidal agents such as ibuprofen &  Naproxen. Tylenol would not cause reflux. If having symptoms ; food & drink should be avoided for @ least 2 hours before going to bed.    Minimal Blood Pressure Goal= AVERAGE < 140/90;  Ideal is an AVERAGE < 135/85. This AVERAGE should be calculated from @ least 5-7 BP readings taken @ different times of day on different days of week. You should not respond to isolated BP readings , but rather the AVERAGE for that week .Please bring your  blood pressure cuff to office visits to verify that it is reliable.It  can also be checked against the blood pressure device at the pharmacy. Finger or wrist cuffs are not dependable; an arm cuff is. 

## 2014-03-15 ENCOUNTER — Telehealth: Payer: Self-pay

## 2014-03-15 ENCOUNTER — Telehealth: Payer: Self-pay | Admitting: Internal Medicine

## 2014-03-15 ENCOUNTER — Other Ambulatory Visit: Payer: Medicare HMO

## 2014-03-15 ENCOUNTER — Other Ambulatory Visit: Payer: Self-pay | Admitting: Internal Medicine

## 2014-03-15 DIAGNOSIS — R7309 Other abnormal glucose: Secondary | ICD-10-CM

## 2014-03-15 DIAGNOSIS — R972 Elevated prostate specific antigen [PSA]: Secondary | ICD-10-CM

## 2014-03-15 LAB — HEMOGLOBIN A1C: HEMOGLOBIN A1C: 5.8 % (ref 4.6–6.5)

## 2014-03-15 NOTE — Telephone Encounter (Signed)
Request for add on has been faxed to lab 

## 2014-03-15 NOTE — Telephone Encounter (Signed)
emmi emailed °

## 2014-03-15 NOTE — Telephone Encounter (Signed)
Message copied by Noreene LarssonANDREWS, Maisen Klingler R on Tue Mar 15, 2014  8:06 AM ------      Message from: Pecola LawlessHOPPER, WILLIAM F      Created: Mon Mar 14, 2014  5:51 PM       Please add A1c (790.29)       ------

## 2014-03-16 ENCOUNTER — Encounter: Payer: Self-pay | Admitting: Internal Medicine

## 2014-03-16 ENCOUNTER — Encounter: Payer: Self-pay | Admitting: Family Medicine

## 2014-03-16 LAB — NMR LIPOPROFILE WITH LIPIDS
Cholesterol, Total: 196 mg/dL (ref 100–199)
HDL Particle Number: 30.5 umol/L (ref >=30.5)
HDL SIZE: 8.5 nm — AB (ref >=9.2)
HDL-C: 44 mg/dL (ref >39)
LARGE HDL: 1.6 umol/L — AB (ref >=4.8)
LDL (calc): 117 mg/dL — ABNORMAL HIGH (ref 0–99)
LDL Particle Number: 1463 nmol/L — ABNORMAL HIGH (ref <1000)
LDL Size: 20.5 nm (ref >=20.8)
LP-IR Score: 89 — ABNORMAL HIGH (ref <=45)
Large VLDL-P: 11 nmol/L — ABNORMAL HIGH (ref <=2.7)
Small LDL Particle Number: 613 nmol/L — ABNORMAL HIGH (ref <=527)
TRIGLYCERIDES: 176 mg/dL — AB (ref 0–149)
VLDL Size: 56 nm — ABNORMAL HIGH (ref <=46.6)

## 2014-03-17 ENCOUNTER — Other Ambulatory Visit: Payer: Self-pay | Admitting: *Deleted

## 2014-03-17 ENCOUNTER — Telehealth: Payer: Self-pay | Admitting: Family Medicine

## 2014-03-17 MED ORDER — CIPROFLOXACIN HCL 500 MG PO TABS: 500.0000 mg | ORAL_TABLET | Freq: Two times a day (BID) | ORAL | Status: DC

## 2014-03-18 NOTE — Telephone Encounter (Signed)
Appt scheduled for 03/22/14 at 4:00.  Patient aware.

## 2014-05-02 ENCOUNTER — Telehealth: Payer: Self-pay | Admitting: Internal Medicine

## 2014-05-02 MED ORDER — RANITIDINE HCL 150 MG PO TABS: 150.0000 mg | ORAL_TABLET | Freq: Two times a day (BID) | ORAL | Status: DC

## 2014-05-02 NOTE — Telephone Encounter (Signed)
150 mg bid #180

## 2014-05-02 NOTE — Telephone Encounter (Signed)
Pt called request refill for ranitidine (ZANTAC) 150 MG to be send to [facility]. Please advise.

## 2014-05-09 ENCOUNTER — Other Ambulatory Visit: Payer: Self-pay | Admitting: Internal Medicine

## 2014-07-14 ENCOUNTER — Telehealth: Payer: Self-pay

## 2014-07-14 MED ORDER — GABAPENTIN 300 MG PO CAPS: 300.0000 mg | ORAL_CAPSULE | Freq: Three times a day (TID) | ORAL | Status: DC

## 2014-07-14 NOTE — Telephone Encounter (Signed)
Done

## 2014-07-14 NOTE — Telephone Encounter (Signed)
Just verify with pharmacy & refill

## 2014-07-14 NOTE — Telephone Encounter (Signed)
Received a refill for gabapentin 300 mg #90 to take one capsule by mouth three times daily. I do not see this on patient's medication list.

## 2014-09-15 ENCOUNTER — Telehealth: Payer: Self-pay | Admitting: Internal Medicine

## 2014-09-15 ENCOUNTER — Other Ambulatory Visit: Payer: Self-pay

## 2014-09-15 MED ORDER — IRBESARTAN 300 MG PO TABS: 300.0000 mg | ORAL_TABLET | Freq: Every day | ORAL | Status: DC

## 2014-09-15 NOTE — Telephone Encounter (Signed)
1 qd Please D/C Losartan

## 2014-09-15 NOTE — Telephone Encounter (Signed)
What directions for irbesartan 300mg  should i send to pharmacy?  thanks

## 2014-09-15 NOTE — Telephone Encounter (Signed)
Ok X 3 mos 

## 2014-09-15 NOTE — Telephone Encounter (Signed)
Advised pt that rx for irbesartan has been called in to [facility]

## 2014-09-15 NOTE — Telephone Encounter (Signed)
Dr Hyacinth MeekerMiller called in said that he has just learned that losartanis not 24 hr and would like to take irbesartan  300mg  .  Wanted to see if [facility] could [facility] [address]

## 2015-01-03 ENCOUNTER — Other Ambulatory Visit: Payer: Self-pay | Admitting: *Deleted

## 2015-01-03 MED ORDER — GABAPENTIN 300 MG PO CAPS
300.0000 mg | ORAL_CAPSULE | Freq: Every day | ORAL | Status: DC
Start: 2015-01-03 — End: 2015-02-23

## 2015-02-02 ENCOUNTER — Other Ambulatory Visit: Payer: Self-pay | Admitting: Internal Medicine

## 2015-02-21 ENCOUNTER — Other Ambulatory Visit (INDEPENDENT_AMBULATORY_CARE_PROVIDER_SITE_OTHER): Payer: Medicare HMO

## 2015-02-21 DIAGNOSIS — R972 Elevated prostate specific antigen [PSA]: Secondary | ICD-10-CM | POA: Diagnosis not present

## 2015-02-21 NOTE — Progress Notes (Signed)
Lab only 

## 2015-02-22 LAB — PSA, TOTAL AND FREE
PSA FREE PCT: 19 %
PSA, Free: 0.57 ng/mL
Prostate Specific Ag, Serum: 3 ng/mL (ref 0.0–4.0)

## 2015-02-23 ENCOUNTER — Encounter: Payer: Self-pay | Admitting: Sports Medicine

## 2015-02-23 ENCOUNTER — Ambulatory Visit (INDEPENDENT_AMBULATORY_CARE_PROVIDER_SITE_OTHER): Payer: Medicare HMO | Admitting: Sports Medicine

## 2015-02-23 VITALS — BP 134/91 | HR 75 | Ht 69.0 in | Wt 170.0 lb

## 2015-02-23 DIAGNOSIS — M5416 Radiculopathy, lumbar region: Secondary | ICD-10-CM | POA: Diagnosis not present

## 2015-02-23 MED ORDER — GABAPENTIN 600 MG PO TABS: 600.0000 mg | ORAL_TABLET | Freq: Three times a day (TID) | ORAL | Status: DC

## 2015-02-23 NOTE — Assessment & Plan Note (Addendum)
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   -Continue with Neurontin when necessary (increase to 600 TID).  As well as tens unit - Lumbar X-rays today  - I do not suspect this is coming from piriformis as he has no tenderness with negative testing.  - consider Tylenol worse Mobic and core strengthening along with further imaging including possible MRI.

## 2015-02-23 NOTE — Progress Notes (Signed)
   Jason Key - 69 y.o. male MRN [id number]  Date of birth: 01-15-1946  Jason Key is a 69 y.o. who presents today for  Sciatic pain.   radiculopathy right-sided, initial visit- patient states this is been ongoing now since the end of August 2016 when he went out for a golf around. He has been golfing recreationally now for about 2 months and tried hitting golf balls further with his driver when he had pain on the right side of his gluteal region. This radiated all the way down to his foot and he self medicated with a prednisone taper as well as Neurontin. This improved his symptoms over the next 1-2 weeks as well as piriformis stretches. He did go sailing on September 10 and reaggravated the area and has been having worsening radicular pain going on the right side. He has been utilizing a 10s unit which is helped quite a bit. He does have a history of cervical radiculopathy in which Neurontin has helped quite a bit.  Pt denies any current bowel/bladder problems, fever, chills, unintentional weight loss, night time awakenings secondary to pain, weakness in one or both legs.     Past Medical History  Diagnosis Date  . HTN (hypertension)   . Hyperlipidemia      Physical Exam Filed Vitals:   02/23/15 1013  BP: 134/91  Pulse: 75    Gen: NAD, Well nourished, Well developed Cardiorespiratory - Normal respiratory effort/rate.  RRR Neuro: CN 2-12 intact, MS 5/5 B/L UE and LE, +2 patellar and achilles relfex b/l  Back Exam: 1.Gait  1. Walk on heels (L5 root) Yes.    Walk on toes (S1 root) Yes.   2. TTP along Lumbar Vertebrae - No. 3. Pain with :   1) Extension - No.   2) Flexion - No. 4. One Legged Hyperextension for Spondy - Negative  5. Straight Leg Raise Yes.    @ 70  6. Sitting Leg Raise - Negative  Negative Piriformis Testing and no TTP in this region.  7. DTR - 2/4 B/L LE 8. MS - 5/5 B/L LE 9. Vascular Exam : DP and PT +2 B/L

## 2015-03-02 ENCOUNTER — Other Ambulatory Visit: Payer: Self-pay | Admitting: *Deleted

## 2015-03-02 ENCOUNTER — Telehealth: Payer: Self-pay | Admitting: *Deleted

## 2015-03-02 MED ORDER — GABAPENTIN 600 MG PO TABS: 600.0000 mg | ORAL_TABLET | Freq: Three times a day (TID) | ORAL | Status: DC

## 2015-03-02 NOTE — Telephone Encounter (Signed)
Pt called saying pharmacy didn't have gabapentin sent in from his last visit.  I checked and the med was written but the electronic submission failed to send.  I resent it today.

## 2015-03-07 ENCOUNTER — Other Ambulatory Visit: Payer: Self-pay | Admitting: *Deleted

## 2015-03-07 DIAGNOSIS — M79604 Pain in right leg: Secondary | ICD-10-CM

## 2015-03-10 ENCOUNTER — Other Ambulatory Visit: Payer: Self-pay | Admitting: Family Medicine

## 2015-03-10 ENCOUNTER — Ambulatory Visit
Admission: RE | Admit: 2015-03-10 | Discharge: 2015-03-10 | Disposition: A | Discharge status: Home / Self Care | Payer: Medicare HMO | Source: Ambulatory Visit | Attending: Family Medicine | Admitting: Family Medicine

## 2015-03-10 DIAGNOSIS — M79604 Pain in right leg: Secondary | ICD-10-CM

## 2015-03-13 ENCOUNTER — Other Ambulatory Visit: Payer: Medicare HMO

## 2015-03-20 ENCOUNTER — Other Ambulatory Visit: Payer: Self-pay | Admitting: *Deleted

## 2015-03-20 ENCOUNTER — Other Ambulatory Visit: Payer: Self-pay | Admitting: Family Medicine

## 2015-03-20 DIAGNOSIS — M4726 Other spondylosis with radiculopathy, lumbar region: Secondary | ICD-10-CM

## 2015-03-20 MED ORDER — OXYCODONE-ACETAMINOPHEN 5-325 MG PO TABS: 1.0000 | ORAL_TABLET | Freq: Four times a day (QID) | ORAL | Status: DC | PRN

## 2015-03-20 MED ORDER — HYDROCODONE-ACETAMINOPHEN 5-325 MG PO TABS: 1.0000 | ORAL_TABLET | Freq: Four times a day (QID) | ORAL | Status: DC | PRN

## 2015-03-20 NOTE — Telephone Encounter (Signed)
Pain rx printed per [facility]M - pt have pain from back and leg  DWM to sign

## 2015-03-22 ENCOUNTER — Ambulatory Visit
Admission: RE | Admit: 2015-03-22 | Discharge: 2015-03-22 | Disposition: A | Discharge status: Home / Self Care | Payer: Medicare HMO | Source: Ambulatory Visit | Attending: Family Medicine | Admitting: Family Medicine

## 2015-03-22 DIAGNOSIS — M4806 Spinal stenosis, lumbar region: Secondary | ICD-10-CM | POA: Diagnosis not present

## 2015-03-22 DIAGNOSIS — M4726 Other spondylosis with radiculopathy, lumbar region: Secondary | ICD-10-CM

## 2015-03-22 MED ORDER — METHYLPREDNISOLONE ACETATE 40 MG/ML INJ SUSP (RADIOLOG
120.0000 mg | Freq: Once | INTRAMUSCULAR | Status: AC
  Administered 2015-03-22: 120 mg via EPIDURAL

## 2015-03-22 MED ORDER — IOHEXOL 180 MG/ML  SOLN
1.0000 mL | Freq: Once | INTRAMUSCULAR | Status: DC | PRN
  Administered 2015-03-22: 1 mL via EPIDURAL

## 2015-03-22 NOTE — Discharge Instructions (Signed)

## 2015-04-03 DIAGNOSIS — M791 Myalgia: Secondary | ICD-10-CM | POA: Diagnosis not present

## 2015-04-03 DIAGNOSIS — M9905 Segmental and somatic dysfunction of pelvic region: Secondary | ICD-10-CM | POA: Diagnosis not present

## 2015-04-03 DIAGNOSIS — M9903 Segmental and somatic dysfunction of lumbar region: Secondary | ICD-10-CM | POA: Diagnosis not present

## 2015-04-03 DIAGNOSIS — M62830 Muscle spasm of back: Secondary | ICD-10-CM | POA: Diagnosis not present

## 2015-04-03 DIAGNOSIS — M9902 Segmental and somatic dysfunction of thoracic region: Secondary | ICD-10-CM | POA: Diagnosis not present

## 2015-04-07 DIAGNOSIS — M9903 Segmental and somatic dysfunction of lumbar region: Secondary | ICD-10-CM | POA: Diagnosis not present

## 2015-04-07 DIAGNOSIS — M62830 Muscle spasm of back: Secondary | ICD-10-CM | POA: Diagnosis not present

## 2015-04-07 DIAGNOSIS — M9905 Segmental and somatic dysfunction of pelvic region: Secondary | ICD-10-CM | POA: Diagnosis not present

## 2015-04-07 DIAGNOSIS — M9902 Segmental and somatic dysfunction of thoracic region: Secondary | ICD-10-CM | POA: Diagnosis not present

## 2015-04-07 DIAGNOSIS — M791 Myalgia: Secondary | ICD-10-CM | POA: Diagnosis not present

## 2015-04-10 DIAGNOSIS — M791 Myalgia: Secondary | ICD-10-CM | POA: Diagnosis not present

## 2015-04-10 DIAGNOSIS — M9905 Segmental and somatic dysfunction of pelvic region: Secondary | ICD-10-CM | POA: Diagnosis not present

## 2015-04-10 DIAGNOSIS — M9902 Segmental and somatic dysfunction of thoracic region: Secondary | ICD-10-CM | POA: Diagnosis not present

## 2015-04-10 DIAGNOSIS — M9903 Segmental and somatic dysfunction of lumbar region: Secondary | ICD-10-CM | POA: Diagnosis not present

## 2015-04-10 DIAGNOSIS — M62830 Muscle spasm of back: Secondary | ICD-10-CM | POA: Diagnosis not present

## 2015-04-13 DIAGNOSIS — M9903 Segmental and somatic dysfunction of lumbar region: Secondary | ICD-10-CM | POA: Diagnosis not present

## 2015-04-13 DIAGNOSIS — M9905 Segmental and somatic dysfunction of pelvic region: Secondary | ICD-10-CM | POA: Diagnosis not present

## 2015-04-13 DIAGNOSIS — M62830 Muscle spasm of back: Secondary | ICD-10-CM | POA: Diagnosis not present

## 2015-04-13 DIAGNOSIS — M9902 Segmental and somatic dysfunction of thoracic region: Secondary | ICD-10-CM | POA: Diagnosis not present

## 2015-04-13 DIAGNOSIS — M791 Myalgia: Secondary | ICD-10-CM | POA: Diagnosis not present

## 2015-05-03 ENCOUNTER — Other Ambulatory Visit: Payer: Self-pay | Admitting: Emergency Medicine

## 2015-05-03 DIAGNOSIS — E785 Hyperlipidemia, unspecified: Secondary | ICD-10-CM

## 2015-05-03 MED ORDER — ATORVASTATIN CALCIUM 20 MG PO TABS: ORAL_TABLET | ORAL | Status: DC

## 2015-05-08 ENCOUNTER — Encounter: Payer: Self-pay | Admitting: Sports Medicine

## 2015-05-08 ENCOUNTER — Ambulatory Visit (INDEPENDENT_AMBULATORY_CARE_PROVIDER_SITE_OTHER): Payer: Medicare HMO | Admitting: Sports Medicine

## 2015-05-08 VITALS — BP 120/82 | Ht 69.0 in | Wt 170.0 lb

## 2015-05-08 DIAGNOSIS — M5416 Radiculopathy, lumbar region: Secondary | ICD-10-CM

## 2015-05-08 DIAGNOSIS — M21371 Foot drop, right foot: Secondary | ICD-10-CM | POA: Diagnosis not present

## 2015-05-08 NOTE — Progress Notes (Signed)
Patient ID: Jason LefevreStephen Key, male   DOB: 07/23/1945, 69 y.o.   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

## 2015-05-08 NOTE — Assessment & Plan Note (Signed)
This has improved somewhat with gabapentin Epidural helped a lot with pain 6 wks ago  Now with foot drop  Suggest trial on dorsiflexion pad for forefoot in shoes RX for ant panel AFO of not improving with forefoot pads  Passive exercises  See if he can restart the mid day dose of Gabapentin - now on 600 bid  Reck 2 mos

## 2015-05-22 DIAGNOSIS — M5416 Radiculopathy, lumbar region: Secondary | ICD-10-CM | POA: Diagnosis not present

## 2015-05-22 DIAGNOSIS — M5126 Other intervertebral disc displacement, lumbar region: Secondary | ICD-10-CM | POA: Diagnosis not present

## 2015-05-25 ENCOUNTER — Other Ambulatory Visit: Payer: Self-pay | Admitting: Internal Medicine

## 2015-06-15 ENCOUNTER — Encounter: Payer: Self-pay | Admitting: Family Medicine

## 2015-06-15 ENCOUNTER — Ambulatory Visit (INDEPENDENT_AMBULATORY_CARE_PROVIDER_SITE_OTHER): Payer: Medicare HMO | Admitting: Family Medicine

## 2015-06-15 VITALS — BP 119/77 | HR 72 | Temp 97.4°F | Ht 69.0 in | Wt 181.0 lb

## 2015-06-15 DIAGNOSIS — M5416 Radiculopathy, lumbar region: Secondary | ICD-10-CM

## 2015-06-15 DIAGNOSIS — E785 Hyperlipidemia, unspecified: Secondary | ICD-10-CM | POA: Diagnosis not present

## 2015-06-15 DIAGNOSIS — K219 Gastro-esophageal reflux disease without esophagitis: Secondary | ICD-10-CM | POA: Diagnosis not present

## 2015-06-15 DIAGNOSIS — I10 Essential (primary) hypertension: Secondary | ICD-10-CM | POA: Diagnosis not present

## 2015-06-15 MED ORDER — IRBESARTAN 300 MG PO TABS: ORAL_TABLET | ORAL | Status: DC

## 2015-06-15 MED ORDER — RANITIDINE HCL 150 MG PO TABS: 150.0000 mg | ORAL_TABLET | Freq: Two times a day (BID) | ORAL | Status: DC

## 2015-06-15 MED ORDER — ATORVASTATIN CALCIUM 20 MG PO TABS: ORAL_TABLET | ORAL | Status: DC

## 2015-06-15 NOTE — Patient Instructions (Addendum)
Medicare Annual Wellness Visit   and the medical providers at [facility] strive to bring you the best medical care.  In doing so we not only want to address your current medical conditions and concerns but also to detect new conditions early and prevent illness, disease and health-related problems.    Medicare offers a yearly Wellness Visit which allows our clinical staff to assess your need for preventative services including immunizations, lifestyle education, counseling to decrease risk of preventable diseases and screening for fall risk and other medical concerns.    This visit is provided free of charge (no copay) for all Medicare recipients. The clinical pharmacists at [facility] have begun to conduct these Wellness Visits which will also include a thorough review of all your medications.    As you primary medical provider recommend that you make an appointment for your Annual Wellness Visit if you have not done so already this year.  You may set up this appointment before you leave today or you may call back [phone] and schedule an appointment.  Please make sure when you call that you mention that you are scheduling your Annual Wellness Visit with the clinical pharmacist so that the appointment may be made for the proper length of time.     Continue current medications. Continue good therapeutic lifestyle changes which include good diet and exercise. Fall precautions discussed with patient. If an FOBT was given today- please return it to our front desk. If you are over 69 years old - you may need Prevnar 13 or the adult Pneumonia vaccine.  **Flu shots are available--- please call and schedule a FLU-CLINIC appointment**  After your visit with us today you will receive a survey in the mail or online from [employer] regarding your care with us. Please take a moment to fill this out. Your feedback is very  important to us as you can help us better understand your patient needs as well as improve your experience and satisfaction. WE CARE ABOUT YOU!!!   Continue medications as doing Continue to follow up with sports medicine physician as needed Get lab work this spring as planned Continue medications as doing Consider  ultrasound of abdomen in the future and ET T Keep appointment with urology

## 2015-06-15 NOTE — Progress Notes (Signed)
Subjective:    Patient ID: Jason Key, male    DOB: 1946/02/23, 70 y.o.   MRN: [id number]  HPI Patient here today for check up and refills on medications. The patient has a history of hypertension and hyperlipidemia GERD and lumbar radiculopathy. He is also followed by the urologist for his prostate. He had a recent elevated PSA which is come back to normal. The patient is doing well with no particular complaints other than his radiculopathy at the present time. He will get lab work this spring. He denies any chest pain or shortness of breath and has had no trouble with his stomach is always he is taking his ranitidine. His passing his water without problems but does see the urologist regularly to follow-up on his one-time elevated PSA.      Patient Active Problem List   Diagnosis Date Noted  . Lumbar radiculopathy, acute 02/23/2015  . Abnormal prostate by palpation 03/14/2014  . Pain in joint, shoulder region 09/21/2013  . Rotator cuff tear, right 09/21/2013  . Dupuytren's fibromatosis 04/28/2012  . Cervical radiculopathy at C8 11/26/2011  . GERD 06/19/2010  . Hyperlipidemia 07/06/2009  . Essential hypertension 07/06/2009  . SLEEP APNEA, MILD 07/06/2009  . Hyperglycemia 07/06/2009   Outpatient Encounter Prescriptions as of 06/15/2015  Medication Sig  . aspirin 81 MG tablet Take 160 mg by mouth. 2-3 x weekly  . atorvastatin (LIPITOR) 20 MG tablet Daily as directed--- Must have office visit for further refills.  . ciprofloxacin (CIPRO) 500 MG tablet Take 1 tablet (500 mg total) by mouth 2 (two) times daily.  Marland Kitchen gabapentin (NEURONTIN) 600 MG tablet Take 1 tablet (600 mg total) by mouth 3 (three) times daily.  Marland Kitchen HYDROcodone-acetaminophen (NORCO/VICODIN) 5-325 MG tablet Take 1 tablet by mouth every 6 (six) hours as needed for moderate pain.  Marland Kitchen irbesartan (AVAPRO) 300 MG tablet TAKE 1 TABLET(300 MG) BY MOUTH DAILY  . naproxen sodium (ANAPROX) 220 MG tablet Take 220 mg by mouth 2 (two)  times daily with a meal.  . nitroGLYCERIN (NITRODUR - DOSED IN MG/24 HR) 0.2 mg/hr patch Apply 1/4 patch to affected area daily.  Change patch every 24 hours.  Marland Kitchen oxyCODONE-acetaminophen (ROXICET) 5-325 MG tablet Take 1 tablet by mouth every 6 (six) hours as needed for severe pain.  . ranitidine (ZANTAC) 150 MG tablet Take 1 tablet (150 mg total) by mouth 2 (two) times daily.   No facility-administered encounter medications on file as of 06/15/2015.     Review of Systems  Constitutional: Negative.   HENT: Negative.   Eyes: Negative.   Respiratory: Negative.   Cardiovascular: Negative.   Gastrointestinal: Negative.   Endocrine: Negative.   Genitourinary: Negative.   Musculoskeletal: Negative.   Skin: Negative.   Allergic/Immunologic: Negative.   Neurological: Negative.   Hematological: Negative.   Psychiatric/Behavioral: Negative.        Objective:   Physical Exam  Constitutional: He is oriented to person, place, and time. He appears well-developed and well-nourished. No distress.  HENT:  Head: Normocephalic and atraumatic.  Right Ear: External ear normal.  Left Ear: External ear normal.  Nose: Nose normal.  Mouth/Throat: Oropharynx is clear and moist. No oropharyngeal exudate.  Eyes: Conjunctivae and EOM are normal. Pupils are equal, round, and reactive to light. Right eye exhibits no discharge. Left eye exhibits no discharge. No scleral icterus.  Always up regularly with Dr. Hazle Quant  Neck: Normal range of motion. Neck supple. No thyromegaly present.  Cardiovascular: Normal rate, regular rhythm,  normal heart sounds and intact distal pulses.   No murmur heard. The rhythm is regular at 72/m  Pulmonary/Chest: Effort normal and breath sounds normal. No respiratory distress. He has no wheezes. He has no rales. He exhibits no tenderness.  Clear anteriorly and posteriorly and no axillary adenopathy  Abdominal: Soft. Bowel sounds are normal. He exhibits no mass. There is no tenderness.  There is no rebound and no guarding.  No liver or spleen enlargement or abdominal masses or inguinal adenopathy. No tenderness.  Genitourinary:  This is done regularly by his urologist, Dr. Laverle PatterBorden  Musculoskeletal: Normal range of motion. He exhibits tenderness. He exhibits no edema.  Tenderness right great toe and inability to dorsiflex  Lymphadenopathy:    He has no cervical adenopathy.  Neurological: He is alert and oriented to person, place, and time. He has normal reflexes. No cranial nerve deficit.  Skin: Skin is warm and dry. No rash noted. No erythema. No pallor.  Psychiatric: He has a normal mood and affect. His behavior is normal. Judgment and thought content normal.  Nursing note and vitals reviewed.  BP 119/77 mmHg  Pulse 72  Temp(Src) 97.4 F (36.3 C) (Oral)  Ht 5\' 9"  (1.753 m)  Wt 181 lb (82.101 kg)  BMI 26.72 kg/m2        Assessment & Plan:  1. Lumbar radiculopathy, acute -Follow up with sports medicine  2. Hyperlipidemia -Lab work is planned in April for cholesterol and BMP and LFTs - atorvastatin (LIPITOR) 20 MG tablet; Daily as directed--- Must have office visit for further refills.  Dispense: 90 tablet; Refill: 3  3. Gastroesophageal reflux disease, esophagitis presence not specified -Continue his Zantac  4. Essential hypertension -Continue current blood pressure medication  Meds ordered this encounter  Medications  . irbesartan (AVAPRO) 300 MG tablet    Sig: TAKE 1 TABLET(300 MG) BY MOUTH DAILY    Dispense:  45 tablet    Refill:  6    PT WILL NEED OFFICE VISIT BEFORE FURTHER REFILLS  . ranitidine (ZANTAC) 150 MG tablet    Sig: Take 1 tablet (150 mg total) by mouth 2 (two) times daily.    Dispense:  180 tablet    Refill:  3  . atorvastatin (LIPITOR) 20 MG tablet    Sig: Daily as directed--- Must have office visit for further refills.    Dispense:  90 tablet    Refill:  3   Patient Instructions                       Medicare Annual Wellness  Visit  [place] and the medical providers at [facility] strive to bring you the best medical care.  In doing so we not only want to address your current medical conditions and concerns but also to detect new conditions early and prevent illness, disease and health-related problems.    Medicare offers a yearly Wellness Visit which allows our clinical staff to assess your need for preventative services including immunizations, lifestyle education, counseling to decrease risk of preventable diseases and screening for fall risk and other medical concerns.    This visit is provided free of charge (no copay) for all Medicare recipients. The clinical pharmacists at [facility] have begun to conduct these Wellness Visits which will also include a thorough review of all your medications.    As you primary medical provider recommend that you make an appointment for your Annual Wellness Visit if  you have not done so already this year.  You may set up this appointment before you leave today or you may call back [phone]) and schedule an appointment.  Please make sure when you call that you mention that you are scheduling your Annual Wellness Visit with the clinical pharmacist so that the appointment may be made for the proper length of time.     Continue current medications. Continue good therapeutic lifestyle changes which include good diet and exercise. Fall precautions discussed with patient. If an FOBT was given today- please return it to our front desk. If you are over 53 years old - you may need Prevnar 13 or the adult Pneumonia vaccine.  **Flu shots are available--- please call and schedule a FLU-CLINIC appointment**  After your visit with [place] today you will receive a survey in the mail or online from [employer] regarding your care with [place]. Please take a moment to fill this out. Your feedback is very important to [place] as you can help [place] better understand  your patient needs as well as improve your experience and satisfaction. WE CARE ABOUT YOU!!!   Continue medications as doing Continue to follow up with sports medicine physician as needed Get lab work this spring as planned Continue medications as doing Consider  ultrasound of abdomen in the future and ET T Keep appointment with urology   Nyra Capes MD

## 2015-06-22 DIAGNOSIS — M21371 Foot drop, right foot: Secondary | ICD-10-CM | POA: Diagnosis not present

## 2015-09-14 DIAGNOSIS — M5416 Radiculopathy, lumbar region: Secondary | ICD-10-CM | POA: Diagnosis not present

## 2015-09-14 DIAGNOSIS — M19042 Primary osteoarthritis, left hand: Secondary | ICD-10-CM | POA: Diagnosis not present

## 2015-09-14 DIAGNOSIS — M19041 Primary osteoarthritis, right hand: Secondary | ICD-10-CM | POA: Diagnosis not present

## 2015-10-09 ENCOUNTER — Other Ambulatory Visit: Payer: Self-pay | Admitting: *Deleted

## 2015-10-09 MED ORDER — GABAPENTIN 600 MG PO TABS: 600.0000 mg | ORAL_TABLET | Freq: Three times a day (TID) | ORAL | Status: DC

## 2015-11-17 ENCOUNTER — Other Ambulatory Visit: Payer: Self-pay | Admitting: *Deleted

## 2015-11-17 MED ORDER — ACYCLOVIR 400 MG PO TABS: 400.0000 mg | ORAL_TABLET | Freq: Two times a day (BID) | ORAL | Status: DC

## 2016-03-27 DIAGNOSIS — Z23 Encounter for immunization: Secondary | ICD-10-CM

## 2016-04-29 ENCOUNTER — Ambulatory Visit (INDEPENDENT_AMBULATORY_CARE_PROVIDER_SITE_OTHER): Payer: Medicare HMO

## 2016-04-29 DIAGNOSIS — Z23 Encounter for immunization: Secondary | ICD-10-CM

## 2016-06-19 ENCOUNTER — Other Ambulatory Visit: Payer: Medicare HMO

## 2016-06-19 ENCOUNTER — Other Ambulatory Visit: Payer: Self-pay | Admitting: *Deleted

## 2016-06-19 DIAGNOSIS — N400 Enlarged prostate without lower urinary tract symptoms: Secondary | ICD-10-CM

## 2016-06-20 LAB — PSA, TOTAL AND FREE
PSA FREE: 0.67 ng/mL
PSA, Free Pct: 10.3 %
Prostate Specific Ag, Serum: 6.5 ng/mL — ABNORMAL HIGH (ref 0.0–4.0)

## 2016-08-28 ENCOUNTER — Other Ambulatory Visit: Payer: Self-pay | Admitting: *Deleted

## 2016-08-28 DIAGNOSIS — E7800 Pure hypercholesterolemia, unspecified: Secondary | ICD-10-CM

## 2016-08-28 MED ORDER — ATORVASTATIN CALCIUM 20 MG PO TABS: ORAL_TABLET | ORAL | 3 refills | Status: AC

## 2016-08-28 MED ORDER — GABAPENTIN 600 MG PO TABS: 600.0000 mg | ORAL_TABLET | Freq: Three times a day (TID) | ORAL | 6 refills | Status: DC

## 2016-08-28 MED ORDER — IRBESARTAN 300 MG PO TABS: ORAL_TABLET | ORAL | 6 refills | Status: DC

## 2016-09-04 DIAGNOSIS — Z4420 Encounter for fitting and adjustment of artificial eye, unspecified: Secondary | ICD-10-CM | POA: Diagnosis not present

## 2016-09-23 DIAGNOSIS — R69 Illness, unspecified: Secondary | ICD-10-CM | POA: Diagnosis not present

## 2016-09-25 DIAGNOSIS — H10411 Chronic giant papillary conjunctivitis, right eye: Secondary | ICD-10-CM | POA: Diagnosis not present

## 2016-11-26 ENCOUNTER — Other Ambulatory Visit: Payer: Self-pay | Admitting: *Deleted

## 2016-11-26 MED ORDER — GABAPENTIN 400 MG PO CAPS: 400.0000 mg | ORAL_CAPSULE | Freq: Every day | ORAL | 3 refills | Status: DC

## 2016-12-18 NOTE — Progress Notes (Signed)
Cardiology Office Note   Date:  12/19/2016   ID:  Jason LefevreStephen Key, DOB 11/14/1945, MRN [id number]  PCP:  Jason Key, Jason W, MD  Cardiologist:   Jason Key Swazil[place], MD   Chief Complaint  Patient presents with  . 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 He notes a one month history of chest pain- localized more to the left chest. Described as more of a pinching sensation. Not associated with exertion and is sporadic. He denies any associated dyspnea or nausea. Feels more fatigued at times. Now only working as fill in. Enjoys playing golf. He does have a history of HTN and HLD. He had a remote nuclear stress test with Jason Key over 15 years ago.  Past Medical History:  Diagnosis Date  . HTN (hypertension)   . Hyperlipidemia     Past Surgical History:  Procedure Laterality Date  . CATARACT EXTRACTION     OD  . COLONOSCOPY  2000 & 2013   Negative,Dr Jason Key  . opthalmologic prosthesis     OS @ 6 (post trauma)     Current Outpatient Prescriptions  Medication Sig Dispense Refill  . acyclovir (ZOVIRAX) 400 MG tablet Take 1 tablet (400 mg total) by mouth 2 (two) times daily. 60 tablet 6  . aspirin 81 MG tablet Take 160 mg by mouth. 2-3 x weekly    . atorvastatin (LIPITOR) 20 MG tablet 1 po QD as directed 90 tablet 3  . gabapentin (NEURONTIN) 400 MG capsule Take 1 capsule (400 mg total) by mouth at bedtime. 90 capsule 3  . irbesartan (AVAPRO) 300 MG tablet TAKE 1 TABLET(300 MG) BY MOUTH DAILY 45 tablet 6  . naproxen sodium (ANAPROX) 220 MG tablet Take 220 mg by mouth 2 (two) times daily with a meal. Reported on 06/15/2015    . ranitidine (ZANTAC) 150 MG tablet Take 1 tablet (150 mg total) by mouth 2 (two) times daily. 180 tablet 3   No current facility-administered medications for this visit.     Allergies:    Lisinopril and Sulfonamide derivatives    Social History:  The patient  reports that he has never smoked. He has never used smokeless tobacco. He reports that he drinks alcohol. He reports that he does not use drugs.   Family History:  The patient's family history includes Cancer in his father; Colon cancer in his maternal grandmother; Heart attack in his maternal grandfather and paternal grandfather; Hypertension in his father and mother.    ROS:  Please see the history of present illness.   Otherwise, review of systems are positive for none.   All other systems are reviewed and negative.    PHYSICAL EXAM: VS:  BP 122/88   Pulse 64   Ht 5\' 8"  (1.727 m)   Wt 181 lb 6.4 oz (82.3 kg)   BMI 27.58 kg/m  , BMI Body mass index is 27.58 kg/m. GEN: Well nourished, well tanned, well developed, in no acute distress  HEENT: normal  Neck: no JVD, carotid bruits, or masses Cardiac: RRR; no murmurs, rubs, or gallops,no edema  Respiratory:  clear to auscultation bilaterally, normal work of breathing GI: soft, nontender, nondistended, + BS MS: no deformity or atrophy  Skin: warm and dry, no rash Neuro:  Strength and sensation are intact  Psych: euthymic mood, full affect   EKG:  EKG is ordered today. The ekg ordered today demonstrates NSR with normal Ecg. I have personally reviewed and interpreted this study.    Recent Labs: No results found for requested labs within last 8760 hours.    Lipid Panel    Component Value Date/Time   CHOL 196 03/14/2014 0857   TRIG 176 (H) 03/14/2014 0857   HDL 44 03/14/2014 0857   CHOLHDL 3 07/10/2011 0817   VLDL 12.4 07/10/2011 0817   LDLCALC 117 (H) 03/14/2014 0857    Dated 08/25/16: cholesterol 151, triglycerides 129, HDL 41, LDL 84.   Wt Readings from Last 3 Encounters:  12/19/16 181 lb 6.4 oz (82.3 kg)  06/15/15 181 lb (82.1 kg)  05/08/15 170 lb (77.1 kg)      Other studies Reviewed: Additional studies/ records that were reviewed today  include: none. Review of the above records demonstrates: N/A   ASSESSMENT AND PLAN:  1.  Atypical chest pain. History of HTN, HLD. Given age and risk factors he is at moderate risk for CAD. Recommend a routine ETT to evaluate. If normal will reassure.  2. HTN controlled. 3. HLD on statin.    Current medicines are reviewed at length with the patient today.  The patient does not have concerns regarding medicines.  The following changes have been made:  no change  Labs/ tests ordered today include:   Orders Placed This Encounter  Procedures  . Exercise Tolerance Test     Disposition:  TBD  Signed, Jason Key [place], MD  12/19/2016 5:19 PM    [facility] [address], [place], [place], [address] Phone [phone], Fax [phone]

## 2016-12-19 ENCOUNTER — Ambulatory Visit (INDEPENDENT_AMBULATORY_CARE_PROVIDER_SITE_OTHER): Payer: Medicare HMO | Admitting: Cardiology

## 2016-12-19 ENCOUNTER — Encounter: Payer: Self-pay | Admitting: Cardiology

## 2016-12-19 VITALS — BP 122/88 | HR 64 | Ht 68.0 in | Wt 181.4 lb

## 2016-12-19 DIAGNOSIS — R0789 Other chest pain: Secondary | ICD-10-CM

## 2016-12-19 DIAGNOSIS — I10 Essential (primary) hypertension: Secondary | ICD-10-CM

## 2016-12-19 DIAGNOSIS — E782 Mixed hyperlipidemia: Secondary | ICD-10-CM | POA: Diagnosis not present

## 2016-12-19 DIAGNOSIS — R972 Elevated prostate specific antigen [PSA]: Secondary | ICD-10-CM | POA: Diagnosis not present

## 2016-12-19 NOTE — Patient Instructions (Signed)
We will schedule you for a stress test  

## 2016-12-23 NOTE — Addendum Note (Signed)
Addended by: Chana BodeGREEN, Samhitha Rosen L on: 12/23/2016 02:33 PM   Modules accepted: Orders

## 2017-01-14 ENCOUNTER — Telehealth (HOSPITAL_COMMUNITY): Payer: Self-pay

## 2017-01-14 NOTE — Telephone Encounter (Signed)
Encounter complete. 

## 2017-01-16 ENCOUNTER — Ambulatory Visit (HOSPITAL_COMMUNITY)
Admission: RE | Admit: 2017-01-16 | Discharge: 2017-01-16 | Disposition: A | Discharge status: Home / Self Care | Payer: Medicare HMO | Source: Ambulatory Visit | Attending: Internal Medicine | Admitting: Internal Medicine

## 2017-01-16 DIAGNOSIS — R0789 Other chest pain: Secondary | ICD-10-CM | POA: Diagnosis not present

## 2017-01-16 DIAGNOSIS — E782 Mixed hyperlipidemia: Secondary | ICD-10-CM | POA: Diagnosis not present

## 2017-01-16 DIAGNOSIS — I10 Essential (primary) hypertension: Secondary | ICD-10-CM

## 2017-01-16 LAB — EXERCISE TOLERANCE TEST
CHL CUP RESTING HR STRESS: 65 {beats}/min
CHL RATE OF PERCEIVED EXERTION: 18
CSEPED: 10 min
CSEPEW: 12.5 METS
CSEPPHR: 142 {beats}/min
Exercise duration (sec): 28 s
MPHR: 150 {beats}/min
Percent HR: 94 %

## 2017-03-19 ENCOUNTER — Other Ambulatory Visit: Payer: Self-pay | Admitting: *Deleted

## 2017-03-19 DIAGNOSIS — R21 Rash and other nonspecific skin eruption: Secondary | ICD-10-CM

## 2017-03-19 MED ORDER — PERMETHRIN 5 % EX CREA: 1.0000 "application " | TOPICAL_CREAM | Freq: Once | CUTANEOUS | 0 refills | Status: AC

## 2017-03-19 MED ORDER — PERMETHRIN 5 % EX CREA: TOPICAL_CREAM | Freq: Once | CUTANEOUS | Status: DC

## 2017-03-25 DIAGNOSIS — Z4420 Encounter for fitting and adjustment of artificial eye, unspecified: Secondary | ICD-10-CM | POA: Diagnosis not present

## 2017-04-22 IMAGING — MR MR LUMBAR SPINE W/O CM
4 of 5 series · 26 of 48 positions shown · non-contrast
Comparison: None.

CLINICAL DATA: RIGHT hip and leg pain. Possible golf injury. RIGHT
great toe numbness. Symptoms for 1 month.

EXAM:
MRI LUMBAR SPINE WITHOUT CONTRAST
TECHNIQUE: Multiplanar, multisequence MR imaging of the lumbar spine was
performed. No intravenous contrast was administered.

[Series 3: T1 · sagittal · 4.0mm · 0.55mm/px · 6 of 13 slices shown (1 of 2)]
[im 1/13]
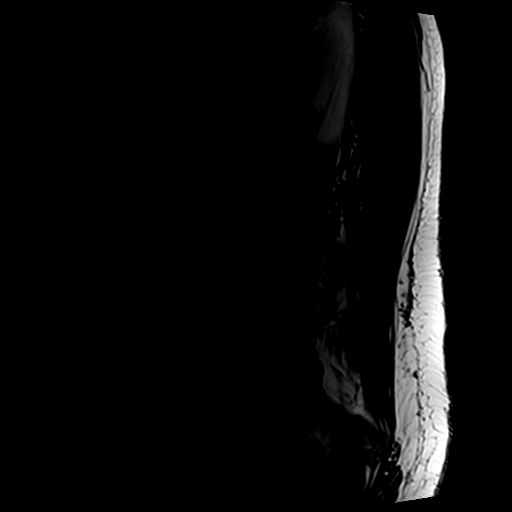
[im 3/13]
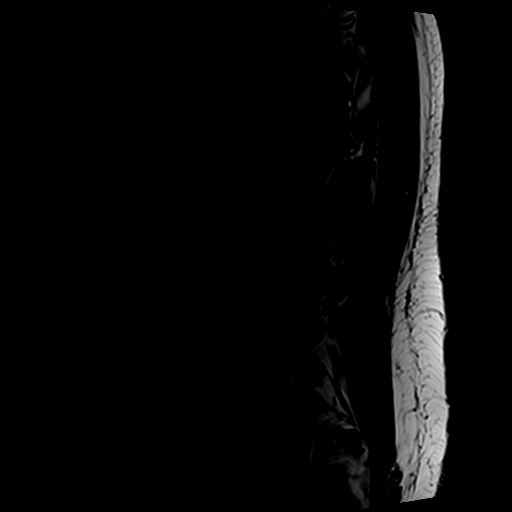
[im 5/13]
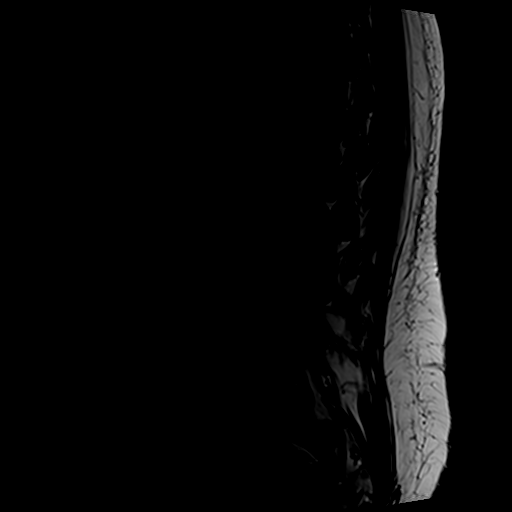
[im 8/13]
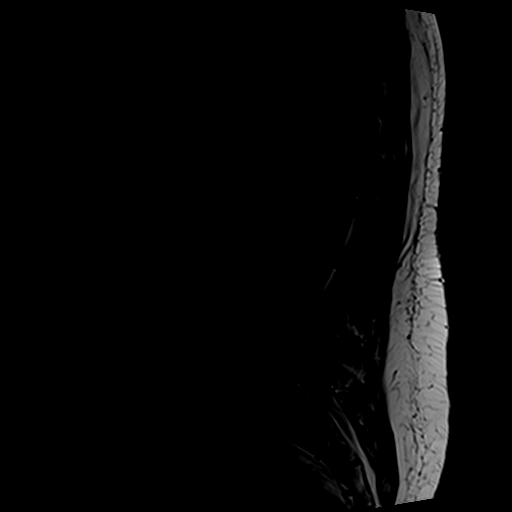
[im 10/13]
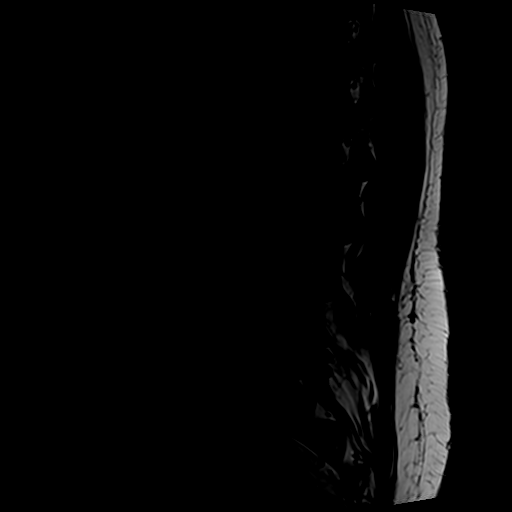
[im 13/13]
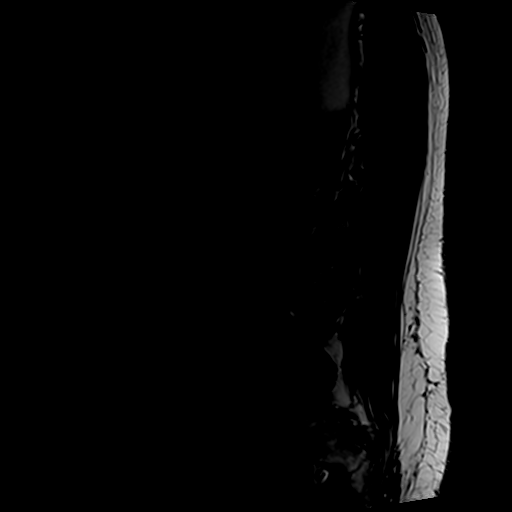

[Series 4: T2 post-contrast · sagittal · 4.0mm · 0.55mm/px · 6 of 13 slices shown]
[im 1/13]
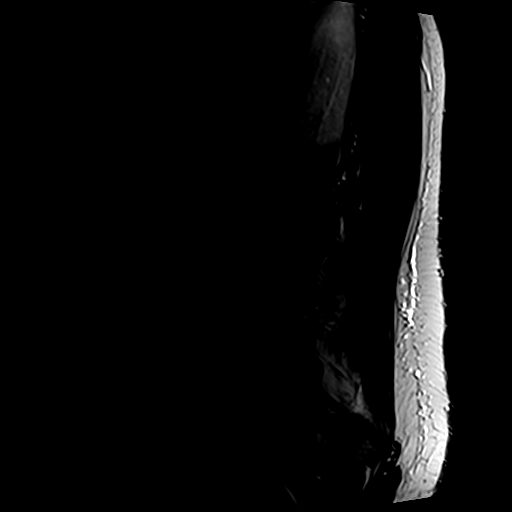
[im 3/13]
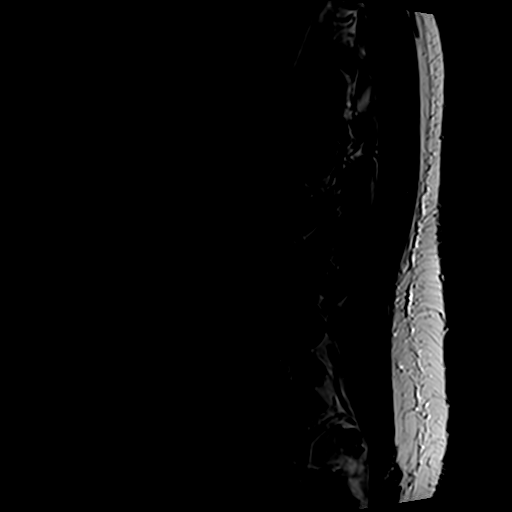
[im 5/13]
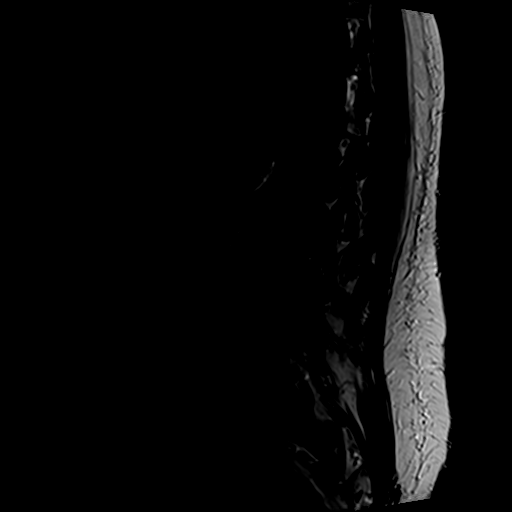
[im 8/13]
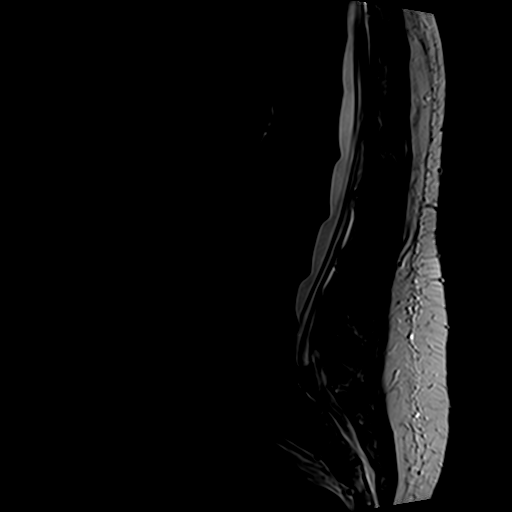
[im 10/13]
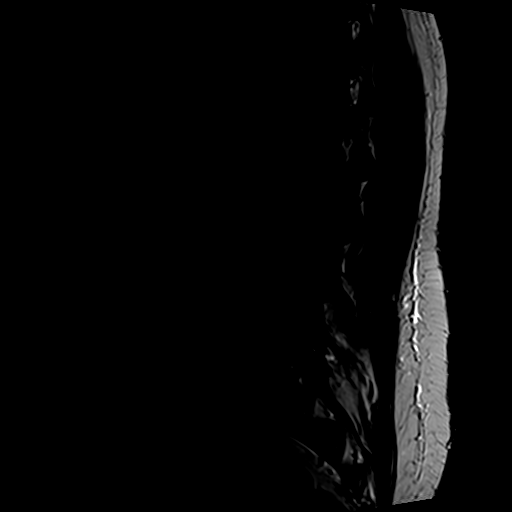
[im 13/13]
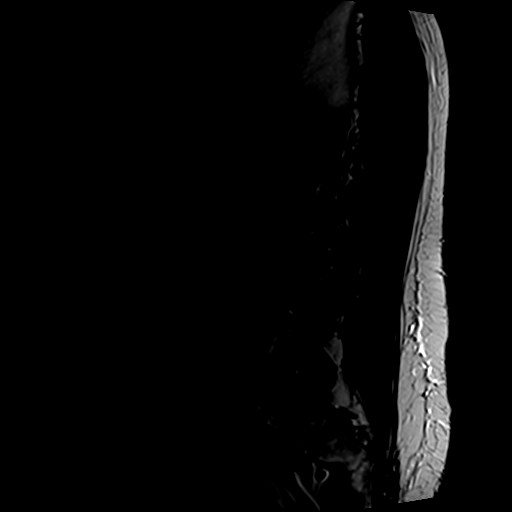

[Series 5: T2 · axial · 4.0mm · 0.70mm/px · z∈[-79,+93]mm · 9 of 31 slices shown]
[im 1/31]
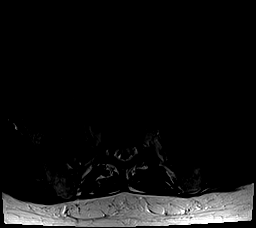
[im 5/31]
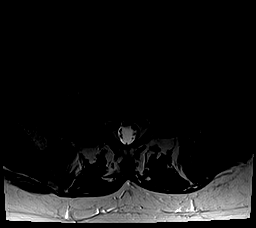
[im 9/31]
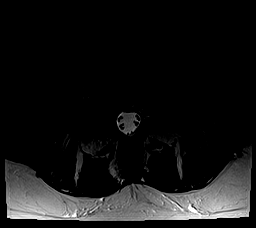
[im 13/31]
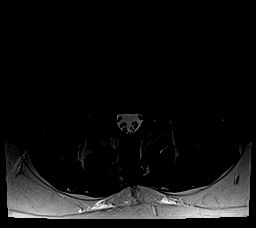
[im 16/31]
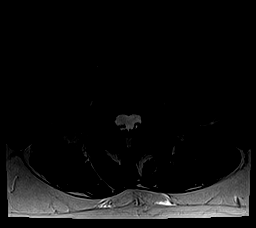
[im 18/31]
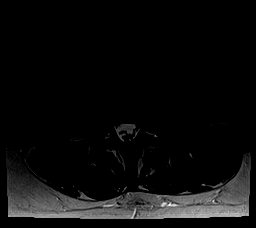
[im 22/31]
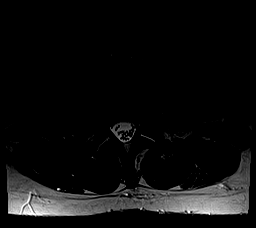
[im 26/31]
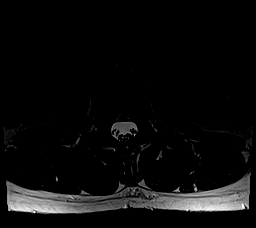
[im 31/31]
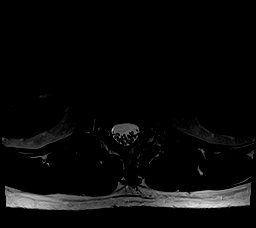

[Series 6: T1 · axial · 4.0mm · 0.35mm/px · z∈[-79,+68]mm · 5 of 31 slices shown (2 of 2)]
[im 1/31]
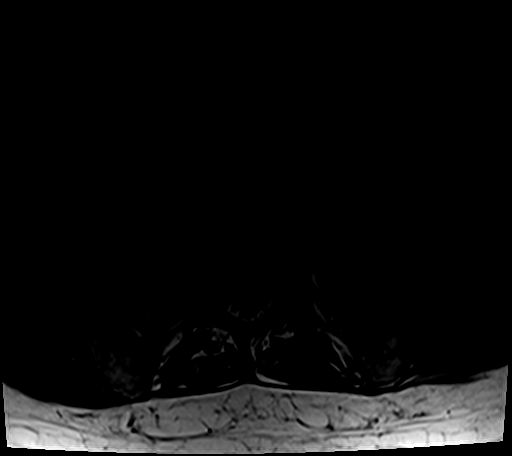
[im 5/31]
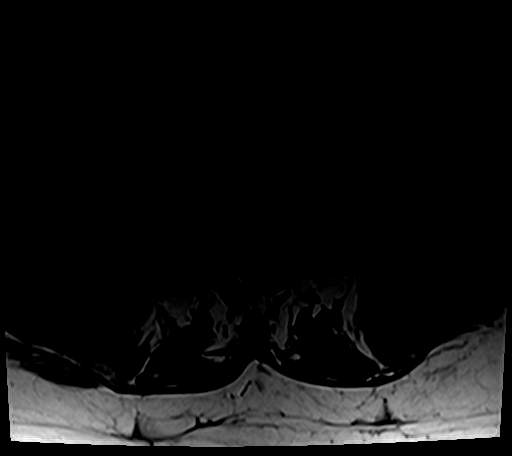
[im 9/31]
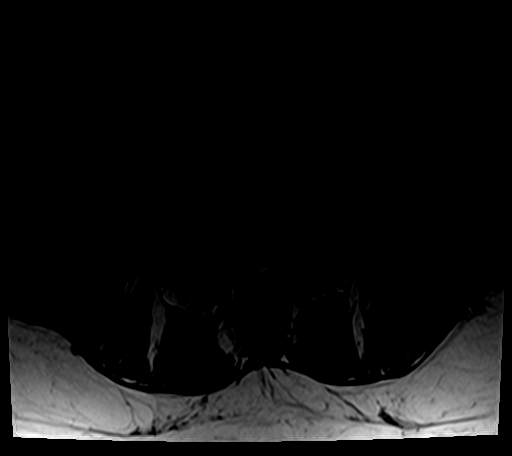
[im 16/31]
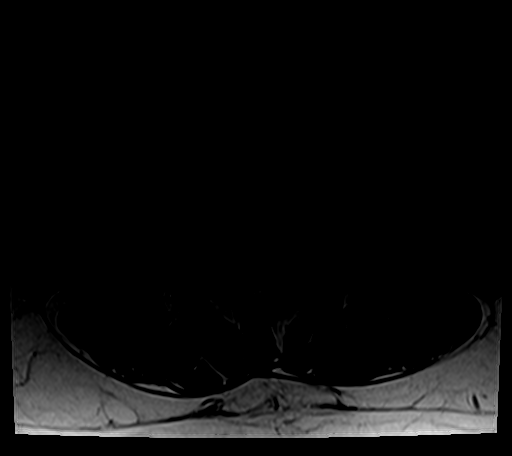
[im 26/31]
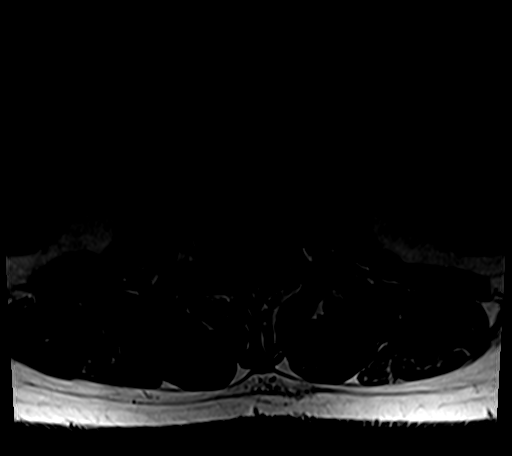

[26 of 48 positions shown; findings below may reference images not displayed]

FINDINGS: Segmentation: Normal.

Alignment: Slight retrolisthesis L1-2, L2-3, and L3-4 appears facet
mediated.

Vertebrae: No worrisome osseous lesion.

Conus medullaris: Normal in size, signal, and location.

Paraspinal tissues: No evidence for hydronephrosis or paravertebral
mass.

Disc levels:

L1-L2:  Mild bulge.  Mild facet arthropathy.  No impingement.

L2-L3: Mild bulge. Mild facet arthropathy. Mild foraminal narrowing.
No impingement.

L3-L4: Shallow central protrusion. BILATERAL facet arthropathy.
Slight subarticular zone and foraminal zone narrowing without
definite impingement.

L4-L5: Central and rightward protrusion extending to the foramen.
Asymmetric facet arthropathy also on the RIGHT. Asymmetric RIGHT
subarticular zone and foraminal zone narrowing could affect the L4
and L5 nerve roots.

L5-S1: Shallow protrusion extends to the RIGHT neural foramen. Mild
facet arthropathy. No significant subarticular zone narrowing. RIGHT
foraminal narrowing could affect the L5 nerve root.
IMPRESSION: Multilevel spondylosis as described. Potentially symptomatic
RIGHT-sided neural impingement could occur at L4-5 or L5-S1.
Correlate clinically for L4 or L5 nerve root impingement.

## 2017-05-14 ENCOUNTER — Other Ambulatory Visit: Payer: Self-pay | Admitting: *Deleted

## 2017-05-14 MED ORDER — IRBESARTAN 300 MG PO TABS: ORAL_TABLET | ORAL | 3 refills | Status: AC

## 2017-07-01 DIAGNOSIS — Z125 Encounter for screening for malignant neoplasm of prostate: Secondary | ICD-10-CM | POA: Diagnosis not present

## 2017-07-09 DIAGNOSIS — R972 Elevated prostate specific antigen [PSA]: Secondary | ICD-10-CM | POA: Diagnosis not present

## 2017-08-06 DIAGNOSIS — R69 Illness, unspecified: Secondary | ICD-10-CM | POA: Diagnosis not present

## 2017-09-01 DIAGNOSIS — I10 Essential (primary) hypertension: Secondary | ICD-10-CM | POA: Diagnosis not present

## 2017-09-01 DIAGNOSIS — Z6826 Body mass index (BMI) 26.0-26.9, adult: Secondary | ICD-10-CM | POA: Diagnosis not present

## 2017-09-01 DIAGNOSIS — M5416 Radiculopathy, lumbar region: Secondary | ICD-10-CM | POA: Diagnosis not present

## 2017-09-02 DIAGNOSIS — R69 Illness, unspecified: Secondary | ICD-10-CM | POA: Diagnosis not present

## 2017-09-04 ENCOUNTER — Other Ambulatory Visit: Payer: Self-pay | Admitting: Neurological Surgery

## 2017-09-04 DIAGNOSIS — M5416 Radiculopathy, lumbar region: Secondary | ICD-10-CM

## 2017-09-12 ENCOUNTER — Encounter: Payer: Self-pay | Admitting: *Deleted

## 2017-09-12 NOTE — Progress Notes (Signed)
Patient came in to have labs drawn and requested blood pressure check.  No complaints at this time.

## 2017-09-18 ENCOUNTER — Ambulatory Visit
Admission: RE | Admit: 2017-09-18 | Discharge: 2017-09-18 | Disposition: A | Discharge status: Home / Self Care | Payer: Medicare HMO | Source: Ambulatory Visit | Attending: Neurological Surgery | Admitting: Neurological Surgery

## 2017-09-18 ENCOUNTER — Other Ambulatory Visit: Payer: Medicare HMO

## 2017-09-18 DIAGNOSIS — M5416 Radiculopathy, lumbar region: Secondary | ICD-10-CM

## 2017-09-18 MED ORDER — METHYLPREDNISOLONE ACETATE 40 MG/ML INJ SUSP (RADIOLOG: 120.0000 mg | Freq: Once | INTRAMUSCULAR | Status: DC

## 2017-09-18 MED ORDER — IOPAMIDOL (ISOVUE-M 200) INJECTION 41%: 1.0000 mL | Freq: Once | INTRAMUSCULAR | Status: DC

## 2017-10-20 ENCOUNTER — Other Ambulatory Visit: Payer: Self-pay | Admitting: *Deleted

## 2017-10-20 MED ORDER — ACYCLOVIR 400 MG PO TABS: 400.0000 mg | ORAL_TABLET | Freq: Two times a day (BID) | ORAL | 6 refills | Status: DC

## 2017-10-31 DIAGNOSIS — R972 Elevated prostate specific antigen [PSA]: Secondary | ICD-10-CM | POA: Diagnosis not present

## 2017-11-03 ENCOUNTER — Other Ambulatory Visit: Payer: Self-pay | Admitting: Family Medicine

## 2017-11-04 ENCOUNTER — Other Ambulatory Visit: Payer: Self-pay | Admitting: *Deleted

## 2017-11-04 MED ORDER — GABAPENTIN 400 MG PO CAPS: 400.0000 mg | ORAL_CAPSULE | Freq: Every day | ORAL | 3 refills | Status: DC

## 2017-11-05 DIAGNOSIS — R972 Elevated prostate specific antigen [PSA]: Secondary | ICD-10-CM | POA: Diagnosis not present

## 2017-11-17 DIAGNOSIS — R972 Elevated prostate specific antigen [PSA]: Secondary | ICD-10-CM | POA: Diagnosis not present

## 2017-12-08 DIAGNOSIS — Z1389 Encounter for screening for other disorder: Secondary | ICD-10-CM | POA: Diagnosis not present

## 2017-12-08 DIAGNOSIS — Z6827 Body mass index (BMI) 27.0-27.9, adult: Secondary | ICD-10-CM | POA: Diagnosis not present

## 2017-12-08 DIAGNOSIS — I10 Essential (primary) hypertension: Secondary | ICD-10-CM | POA: Diagnosis not present

## 2017-12-08 DIAGNOSIS — G4733 Obstructive sleep apnea (adult) (pediatric): Secondary | ICD-10-CM | POA: Diagnosis not present

## 2017-12-08 DIAGNOSIS — M5416 Radiculopathy, lumbar region: Secondary | ICD-10-CM | POA: Diagnosis not present

## 2017-12-08 DIAGNOSIS — E7849 Other hyperlipidemia: Secondary | ICD-10-CM | POA: Diagnosis not present

## 2017-12-08 DIAGNOSIS — G6289 Other specified polyneuropathies: Secondary | ICD-10-CM | POA: Diagnosis not present

## 2017-12-08 DIAGNOSIS — R7301 Impaired fasting glucose: Secondary | ICD-10-CM | POA: Diagnosis not present

## 2017-12-08 DIAGNOSIS — Z23 Encounter for immunization: Secondary | ICD-10-CM | POA: Diagnosis not present

## 2018-06-12 DIAGNOSIS — R972 Elevated prostate specific antigen [PSA]: Secondary | ICD-10-CM | POA: Diagnosis not present

## 2018-06-19 DIAGNOSIS — N5201 Erectile dysfunction due to arterial insufficiency: Secondary | ICD-10-CM | POA: Diagnosis not present

## 2018-06-19 DIAGNOSIS — R972 Elevated prostate specific antigen [PSA]: Secondary | ICD-10-CM | POA: Diagnosis not present

## 2018-07-23 DIAGNOSIS — Z6828 Body mass index (BMI) 28.0-28.9, adult: Secondary | ICD-10-CM | POA: Diagnosis not present

## 2018-07-23 DIAGNOSIS — N390 Urinary tract infection, site not specified: Secondary | ICD-10-CM | POA: Diagnosis not present

## 2018-07-23 DIAGNOSIS — A4189 Other specified sepsis: Secondary | ICD-10-CM | POA: Diagnosis not present

## 2018-07-23 DIAGNOSIS — N410 Acute prostatitis: Secondary | ICD-10-CM | POA: Diagnosis not present

## 2018-07-23 DIAGNOSIS — R82998 Other abnormal findings in urine: Secondary | ICD-10-CM | POA: Diagnosis not present

## 2018-07-23 DIAGNOSIS — K5909 Other constipation: Secondary | ICD-10-CM | POA: Diagnosis not present

## 2018-07-23 DIAGNOSIS — K219 Gastro-esophageal reflux disease without esophagitis: Secondary | ICD-10-CM | POA: Diagnosis not present

## 2018-07-23 DIAGNOSIS — N179 Acute kidney failure, unspecified: Secondary | ICD-10-CM | POA: Diagnosis not present

## 2018-07-27 DIAGNOSIS — A419 Sepsis, unspecified organism: Secondary | ICD-10-CM | POA: Diagnosis not present

## 2018-08-19 DIAGNOSIS — R69 Illness, unspecified: Secondary | ICD-10-CM | POA: Diagnosis not present

## 2018-12-09 DIAGNOSIS — I10 Essential (primary) hypertension: Secondary | ICD-10-CM | POA: Diagnosis not present

## 2018-12-09 DIAGNOSIS — Z125 Encounter for screening for malignant neoplasm of prostate: Secondary | ICD-10-CM | POA: Diagnosis not present

## 2018-12-09 DIAGNOSIS — E7849 Other hyperlipidemia: Secondary | ICD-10-CM | POA: Diagnosis not present

## 2018-12-09 DIAGNOSIS — R7301 Impaired fasting glucose: Secondary | ICD-10-CM | POA: Diagnosis not present

## 2018-12-11 ENCOUNTER — Other Ambulatory Visit: Payer: Self-pay | Admitting: Family Medicine

## 2018-12-15 DIAGNOSIS — M5416 Radiculopathy, lumbar region: Secondary | ICD-10-CM | POA: Diagnosis not present

## 2018-12-15 DIAGNOSIS — Z0000 Encounter for general adult medical examination without abnormal findings: Secondary | ICD-10-CM | POA: Diagnosis not present

## 2018-12-15 DIAGNOSIS — R972 Elevated prostate specific antigen [PSA]: Secondary | ICD-10-CM | POA: Diagnosis not present

## 2018-12-15 DIAGNOSIS — I10 Essential (primary) hypertension: Secondary | ICD-10-CM | POA: Diagnosis not present

## 2018-12-15 DIAGNOSIS — R7301 Impaired fasting glucose: Secondary | ICD-10-CM | POA: Diagnosis not present

## 2018-12-15 DIAGNOSIS — E785 Hyperlipidemia, unspecified: Secondary | ICD-10-CM | POA: Diagnosis not present

## 2018-12-15 DIAGNOSIS — K219 Gastro-esophageal reflux disease without esophagitis: Secondary | ICD-10-CM | POA: Diagnosis not present

## 2018-12-15 DIAGNOSIS — Z87438 Personal history of other diseases of male genital organs: Secondary | ICD-10-CM | POA: Diagnosis not present

## 2018-12-15 DIAGNOSIS — G4733 Obstructive sleep apnea (adult) (pediatric): Secondary | ICD-10-CM | POA: Diagnosis not present

## 2018-12-15 DIAGNOSIS — G629 Polyneuropathy, unspecified: Secondary | ICD-10-CM | POA: Diagnosis not present

## 2019-01-12 DIAGNOSIS — M5416 Radiculopathy, lumbar region: Secondary | ICD-10-CM | POA: Diagnosis not present

## 2019-01-22 ENCOUNTER — Other Ambulatory Visit: Payer: Self-pay | Admitting: Neurosurgery

## 2019-01-22 DIAGNOSIS — M5416 Radiculopathy, lumbar region: Secondary | ICD-10-CM

## 2019-01-29 ENCOUNTER — Other Ambulatory Visit: Payer: Self-pay | Admitting: Neurosurgery

## 2019-01-29 ENCOUNTER — Other Ambulatory Visit: Payer: Self-pay

## 2019-01-29 ENCOUNTER — Ambulatory Visit
Admission: RE | Admit: 2019-01-29 | Discharge: 2019-01-29 | Disposition: A | Discharge status: Home / Self Care | Payer: Medicare HMO | Source: Ambulatory Visit | Attending: Neurosurgery | Admitting: Neurosurgery

## 2019-01-29 DIAGNOSIS — M5416 Radiculopathy, lumbar region: Secondary | ICD-10-CM

## 2019-01-29 DIAGNOSIS — M5126 Other intervertebral disc displacement, lumbar region: Secondary | ICD-10-CM | POA: Diagnosis not present

## 2019-01-29 MED ORDER — IOPAMIDOL (ISOVUE-M 200) INJECTION 41%
1.0000 mL | Freq: Once | INTRAMUSCULAR | Status: AC
  Administered 2019-01-29: 1 mL via EPIDURAL

## 2019-01-29 MED ORDER — METHYLPREDNISOLONE ACETATE 40 MG/ML INJ SUSP (RADIOLOG
120.0000 mg | Freq: Once | INTRAMUSCULAR | Status: AC
  Administered 2019-01-29: 120 mg via EPIDURAL

## 2019-01-29 NOTE — Discharge Instructions (Signed)

## 2019-02-01 ENCOUNTER — Other Ambulatory Visit: Payer: Medicare HMO

## 2019-03-17 DIAGNOSIS — R69 Illness, unspecified: Secondary | ICD-10-CM | POA: Diagnosis not present

## 2019-04-08 DIAGNOSIS — Z23 Encounter for immunization: Secondary | ICD-10-CM | POA: Diagnosis not present

## 2019-07-23 DIAGNOSIS — R972 Elevated prostate specific antigen [PSA]: Secondary | ICD-10-CM | POA: Diagnosis not present

## 2019-07-30 DIAGNOSIS — R972 Elevated prostate specific antigen [PSA]: Secondary | ICD-10-CM | POA: Diagnosis not present

## 2019-08-12 ENCOUNTER — Telehealth: Payer: Self-pay | Admitting: Neurology

## 2019-08-12 NOTE — Telephone Encounter (Signed)
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  He wishes to be evaluated for this.  I will try to get an appointment set up for him.

## 2019-08-12 NOTE — Telephone Encounter (Signed)
Spoke w/ pt, and he's been scheduled for tomm at 10:45

## 2019-08-13 ENCOUNTER — Ambulatory Visit: Payer: Medicare HMO | Admitting: Neurology

## 2019-08-13 ENCOUNTER — Other Ambulatory Visit: Payer: Self-pay

## 2019-08-13 ENCOUNTER — Encounter: Payer: Self-pay | Admitting: Neurology

## 2019-08-13 DIAGNOSIS — H814 Vertigo of central origin: Secondary | ICD-10-CM

## 2019-08-13 NOTE — Progress Notes (Signed)
Reason for visit: Vertigo  Jason Key is a 74 y.o. 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  The patient has noted a several year history of intermittent vertigo that generally occurs while rolling on his right side while sleeping.  If he stays still, the vertigo will last for about a minute or so and then dissipate.  He never gets nausea or vomiting with this.  Even when he is not lying down, sometimes when he cuts his eyes to one side or the other rapidly, he may get a brief sensation of lightheadedness.  The patient also has some problems with intermittent headaches that may come on once every 2 or 3 weeks.  He may times get photophobia and some nausea but no vomiting.  He has had episodes of patchy visual changes.  Approximately 3 years ago, he did have an event of sudden visual loss with a homonymous visual field cut, this lasted about an hour and then cleared, he did get a headache after the event.  The patient has not had any recurrence of similar episodes.  He reports no focal numbness or weakness of the face, arms, legs.  He does have some mild gait instability.  He has a history of low back pain, he did have a right foot drop at one point that cleared without surgery.  He has some residual numbness in the sole of the foot on the right.  The patient does have some neck pain at times.  He denies any changes in hearing or ringing in the ears, he denies any ear pain.  He does occasionally have some problems with choking with swallowing.  He denies difficulty controlling the bowels or the bladder.  He comes to this office for an evaluation.  Past Medical History:  Diagnosis Date  . HTN (hypertension)   . Hyperlipidemia     Past Surgical History:  Procedure Laterality Date  . CATARACT EXTRACTION     OD  . COLONOSCOPY  2000 & 2013   Negative,Dr Medoff  . opthalmologic prosthesis     OS @ 6  (post trauma)    Family History  Problem Relation Age of Onset  . Hypertension Mother   . Cancer Father        renal  . Hypertension Father   . Colon cancer Maternal Grandmother   . Heart attack Paternal Grandfather        MI in 6s  . Heart attack Maternal Grandfather        Mi in 29s  . Diabetes Neg Hx   . Stroke Neg Hx     Social history:  reports that he has never smoked. He has never used smokeless tobacco. He reports current alcohol use. He reports that he does not use drugs.  Medications:  Prior to Admission medications   Medication Sig Start Date End Date Taking? Authorizing Provider  aspirin 81 MG tablet Take 160 mg by mouth. 2-3 x weekly   Yes [provider]  atorvastatin (LIPITOR) 20 MG tablet 1 po QD as directed Patient taking differently: Take every 5th day 08/28/16  Yes Chipper Herb, MD  irbesartan (AVAPRO) 300 MG tablet TAKE 1 TABLET(300 MG) BY MOUTH DAILY 05/14/17  Yes Chipper Herb, MD  naproxen sodium (ANAPROX) 220 MG tablet Take 220 mg by mouth 2 (two) times daily with a meal. Reported on 06/15/2015   Yes [provider]      Allergies  Allergen Reactions  . Lisinopril Other (See Comments)    Cough   . Sulfonamide Derivatives Rash    ROS:  Out of a complete 14 system review of symptoms, the patient complains only of the following symptoms, and all other reviewed systems are negative.  Vertigo Headache Low back pain  Blood pressure 134/78, pulse 71, temperature (!) 97.2 F (36.2 C), height 5\' 9"  (1.753 m), weight 183 lb (83 kg).  Physical Exam  General: The patient is alert and cooperative at the time of the examination.  Eyes: Pupil is round, and reactive to light. Disc is flat on the right, the left eye is false.  Ears: Tympanic membrane on the right is slightly sclerotic, left side is unremarkable.  Neck: The neck is supple, no carotid bruits are noted.  Respiratory: The respiratory examination is  clear.  Cardiovascular: The cardiovascular examination reveals a regular rate and rhythm, no obvious murmurs or rubs are noted.  Skin: Extremities are without significant edema.  Neurologic Exam  Mental status: The patient is alert and oriented x 3 at the time of the examination. The patient has apparent normal recent and remote memory, with an apparently normal attention span and concentration ability.  Cranial nerves: Facial symmetry is present. There is good sensation of the face to pinprick and soft touch bilaterally. The strength of the facial muscles and the muscles to head turning and shoulder shrug are normal bilaterally. Speech is well enunciated, no aphasia or dysarthria is noted. Extraocular movements are full. Visual fields are full. The tongue is midline, and the patient has symmetric elevation of the soft palate. No obvious hearing deficits are noted.  Motor: The motor testing reveals 5 over 5 strength of all 4 extremities. Good symmetric motor tone is noted throughout.  Sensory: Sensory testing is intact to pinprick, soft touch, vibration sensation, and position sense on all 4 extremities. No evidence of extinction is noted.  Coordination: Cerebellar testing reveals good finger-nose-finger and heel-to-shin bilaterally.  Gait and station: Gait is normal. Tandem gait is slightly unsteady. Romberg is negative. No drift is seen. The Nyan-barrany procedure was done, the patient reported no vertigo with this.  Occasional end gaze nystagmus was seen with medial deviation of the right eye.  Reflexes: Deep tendon reflexes are symmetric and normal bilaterally. Toes are downgoing bilaterally.   Assessment/Plan:  1.  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  History of headache, probable migraine  3.  History of homonymous visual field deficit, possible TIA versus migraine phenomena  The patient will be set up for MRI of the brain, if evidence of cerebrovascular  disease is noted, we will pursue further evaluation.  The vertigo itself is likely related to benign positional vertigo, the patient may try the Epley maneuvers on his own to see if this improves.  The patient will follow up if needed.  MD 08/13/2019 10:35 AM  [facility] [address] [place], [place]  Phone [phone] Fax [phone]

## 2019-08-16 ENCOUNTER — Telehealth: Payer: Self-pay | Admitting: Neurology

## 2019-08-16 NOTE — Telephone Encounter (Signed)
[employer] medicare order sent to [facility]. They will obtain the auth and reach out to the patient to schedule.  °

## 2019-08-26 NOTE — Telephone Encounter (Signed)
[employer] medicare [name]: [id number] (exp. 08/26/19 to 02/22/20) patient is scheduled at [facility] for 08/28/19.

## 2019-08-28 ENCOUNTER — Other Ambulatory Visit: Payer: Self-pay

## 2019-08-28 ENCOUNTER — Ambulatory Visit
Admission: RE | Admit: 2019-08-28 | Discharge: 2019-08-28 | Disposition: A | Discharge status: Home / Self Care | Payer: Medicare HMO | Source: Ambulatory Visit | Attending: Neurology | Admitting: Neurology

## 2019-08-28 DIAGNOSIS — H814 Vertigo of central origin: Secondary | ICD-10-CM | POA: Diagnosis not present

## 2019-08-30 ENCOUNTER — Telehealth: Payer: Self-pay | Admitting: Neurology

## 2019-08-30 NOTE — Telephone Encounter (Signed)
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  The patient does have a right maxillary sinus mucous retention cyst, I do not think this is a source of vertigo, he likely has positional vertigo, he will treated with the Epley maneuvers and contact me if he is not improving.   MRI brain 08/29/19:  IMPRESSION:   Normal MRI brain (without). Incidental right maxillary sinus mucus retention cyst.

## 2019-09-20 ENCOUNTER — Other Ambulatory Visit: Payer: Self-pay | Admitting: Neurological Surgery

## 2019-09-20 DIAGNOSIS — M5416 Radiculopathy, lumbar region: Secondary | ICD-10-CM

## 2019-09-28 ENCOUNTER — Ambulatory Visit
Admission: RE | Admit: 2019-09-28 | Discharge: 2019-09-28 | Disposition: A | Discharge status: Home / Self Care | Payer: Medicare HMO | Source: Ambulatory Visit | Attending: Neurological Surgery | Admitting: Neurological Surgery

## 2019-09-28 ENCOUNTER — Other Ambulatory Visit: Payer: Self-pay

## 2019-09-28 DIAGNOSIS — M48061 Spinal stenosis, lumbar region without neurogenic claudication: Secondary | ICD-10-CM | POA: Diagnosis not present

## 2019-09-28 DIAGNOSIS — M5416 Radiculopathy, lumbar region: Secondary | ICD-10-CM

## 2019-09-28 MED ORDER — IOPAMIDOL (ISOVUE-M 200) INJECTION 41%
1.0000 mL | Freq: Once | INTRAMUSCULAR | Status: AC
  Administered 2019-09-28: 1 mL via EPIDURAL

## 2019-09-28 MED ORDER — METHYLPREDNISOLONE ACETATE 40 MG/ML INJ SUSP (RADIOLOG
120.0000 mg | Freq: Once | INTRAMUSCULAR | Status: AC
  Administered 2019-09-28: 120 mg via EPIDURAL

## 2019-09-28 NOTE — Discharge Instructions (Signed)

## 2019-10-01 ENCOUNTER — Other Ambulatory Visit: Payer: Self-pay | Admitting: Neurological Surgery

## 2019-10-01 DIAGNOSIS — M5416 Radiculopathy, lumbar region: Secondary | ICD-10-CM

## 2019-10-04 ENCOUNTER — Other Ambulatory Visit: Payer: Self-pay

## 2019-10-04 ENCOUNTER — Ambulatory Visit
Admission: RE | Admit: 2019-10-04 | Discharge: 2019-10-04 | Disposition: A | Discharge status: Home / Self Care | Payer: Medicare HMO | Source: Ambulatory Visit | Attending: Neurological Surgery | Admitting: Neurological Surgery

## 2019-10-04 DIAGNOSIS — M48061 Spinal stenosis, lumbar region without neurogenic claudication: Secondary | ICD-10-CM | POA: Diagnosis not present

## 2019-10-04 DIAGNOSIS — M5416 Radiculopathy, lumbar region: Secondary | ICD-10-CM

## 2019-10-11 DIAGNOSIS — Z1152 Encounter for screening for COVID-19: Secondary | ICD-10-CM | POA: Diagnosis not present

## 2019-10-12 DIAGNOSIS — R69 Illness, unspecified: Secondary | ICD-10-CM | POA: Diagnosis not present

## 2019-10-18 DIAGNOSIS — M5116 Intervertebral disc disorders with radiculopathy, lumbar region: Secondary | ICD-10-CM | POA: Diagnosis not present

## 2019-10-18 DIAGNOSIS — M5126 Other intervertebral disc displacement, lumbar region: Secondary | ICD-10-CM | POA: Diagnosis not present

## 2019-10-18 DIAGNOSIS — M48061 Spinal stenosis, lumbar region without neurogenic claudication: Secondary | ICD-10-CM | POA: Diagnosis not present

## 2019-12-01 DIAGNOSIS — M4696 Unspecified inflammatory spondylopathy, lumbar region: Secondary | ICD-10-CM | POA: Diagnosis not present

## 2019-12-01 DIAGNOSIS — M4807 Spinal stenosis, lumbosacral region: Secondary | ICD-10-CM | POA: Diagnosis not present

## 2019-12-01 DIAGNOSIS — M5126 Other intervertebral disc displacement, lumbar region: Secondary | ICD-10-CM | POA: Diagnosis not present

## 2019-12-01 DIAGNOSIS — M5127 Other intervertebral disc displacement, lumbosacral region: Secondary | ICD-10-CM | POA: Diagnosis not present

## 2019-12-02 DIAGNOSIS — M5416 Radiculopathy, lumbar region: Secondary | ICD-10-CM | POA: Diagnosis not present

## 2019-12-03 DIAGNOSIS — M5126 Other intervertebral disc displacement, lumbar region: Secondary | ICD-10-CM | POA: Diagnosis not present

## 2019-12-03 DIAGNOSIS — Z9889 Other specified postprocedural states: Secondary | ICD-10-CM | POA: Diagnosis not present

## 2019-12-03 DIAGNOSIS — M5116 Intervertebral disc disorders with radiculopathy, lumbar region: Secondary | ICD-10-CM | POA: Diagnosis not present

## 2019-12-10 DIAGNOSIS — H0100A Unspecified blepharitis right eye, upper and lower eyelids: Secondary | ICD-10-CM | POA: Diagnosis not present

## 2019-12-10 DIAGNOSIS — H52201 Unspecified astigmatism, right eye: Secondary | ICD-10-CM | POA: Diagnosis not present

## 2019-12-10 DIAGNOSIS — H3500 Unspecified background retinopathy: Secondary | ICD-10-CM | POA: Diagnosis not present

## 2019-12-10 DIAGNOSIS — H43811 Vitreous degeneration, right eye: Secondary | ICD-10-CM | POA: Diagnosis not present

## 2019-12-24 ENCOUNTER — Other Ambulatory Visit: Payer: Self-pay

## 2019-12-24 ENCOUNTER — Encounter (INDEPENDENT_AMBULATORY_CARE_PROVIDER_SITE_OTHER): Payer: Medicare HMO | Admitting: Ophthalmology

## 2019-12-24 DIAGNOSIS — H43813 Vitreous degeneration, bilateral: Secondary | ICD-10-CM | POA: Diagnosis not present

## 2019-12-24 DIAGNOSIS — I10 Essential (primary) hypertension: Secondary | ICD-10-CM | POA: Diagnosis not present

## 2019-12-24 DIAGNOSIS — H35033 Hypertensive retinopathy, bilateral: Secondary | ICD-10-CM | POA: Diagnosis not present

## 2019-12-24 DIAGNOSIS — H3561 Retinal hemorrhage, right eye: Secondary | ICD-10-CM | POA: Diagnosis not present

## 2019-12-29 DIAGNOSIS — Z1331 Encounter for screening for depression: Secondary | ICD-10-CM | POA: Diagnosis not present

## 2019-12-29 DIAGNOSIS — R7301 Impaired fasting glucose: Secondary | ICD-10-CM | POA: Diagnosis not present

## 2019-12-29 DIAGNOSIS — G4700 Insomnia, unspecified: Secondary | ICD-10-CM | POA: Diagnosis not present

## 2019-12-29 DIAGNOSIS — M5416 Radiculopathy, lumbar region: Secondary | ICD-10-CM | POA: Diagnosis not present

## 2019-12-29 DIAGNOSIS — K219 Gastro-esophageal reflux disease without esophagitis: Secondary | ICD-10-CM | POA: Diagnosis not present

## 2019-12-29 DIAGNOSIS — E785 Hyperlipidemia, unspecified: Secondary | ICD-10-CM | POA: Diagnosis not present

## 2019-12-29 DIAGNOSIS — Z23 Encounter for immunization: Secondary | ICD-10-CM | POA: Diagnosis not present

## 2019-12-29 DIAGNOSIS — G4733 Obstructive sleep apnea (adult) (pediatric): Secondary | ICD-10-CM | POA: Diagnosis not present

## 2019-12-29 DIAGNOSIS — G629 Polyneuropathy, unspecified: Secondary | ICD-10-CM | POA: Diagnosis not present

## 2019-12-29 DIAGNOSIS — Z0000 Encounter for general adult medical examination without abnormal findings: Secondary | ICD-10-CM | POA: Diagnosis not present

## 2019-12-29 DIAGNOSIS — I10 Essential (primary) hypertension: Secondary | ICD-10-CM | POA: Diagnosis not present

## 2020-04-13 DIAGNOSIS — K5730 Diverticulosis of large intestine without perforation or abscess without bleeding: Secondary | ICD-10-CM | POA: Diagnosis not present

## 2020-04-13 DIAGNOSIS — Z800 Family history of malignant neoplasm of digestive organs: Secondary | ICD-10-CM | POA: Diagnosis not present

## 2020-04-13 DIAGNOSIS — Z1211 Encounter for screening for malignant neoplasm of colon: Secondary | ICD-10-CM | POA: Diagnosis not present

## 2020-04-13 DIAGNOSIS — Z8371 Family history of colonic polyps: Secondary | ICD-10-CM | POA: Diagnosis not present

## 2020-05-03 DIAGNOSIS — R69 Illness, unspecified: Secondary | ICD-10-CM | POA: Diagnosis not present

## 2020-06-26 ENCOUNTER — Encounter (INDEPENDENT_AMBULATORY_CARE_PROVIDER_SITE_OTHER): Payer: Medicare HMO | Admitting: Ophthalmology

## 2020-07-10 ENCOUNTER — Encounter (INDEPENDENT_AMBULATORY_CARE_PROVIDER_SITE_OTHER): Payer: Medicare HMO | Admitting: Ophthalmology

## 2020-07-10 ENCOUNTER — Other Ambulatory Visit: Payer: Self-pay

## 2020-07-10 DIAGNOSIS — I10 Essential (primary) hypertension: Secondary | ICD-10-CM | POA: Diagnosis not present

## 2020-07-10 DIAGNOSIS — H43811 Vitreous degeneration, right eye: Secondary | ICD-10-CM | POA: Diagnosis not present

## 2020-07-10 DIAGNOSIS — H35031 Hypertensive retinopathy, right eye: Secondary | ICD-10-CM | POA: Diagnosis not present

## 2020-07-10 DIAGNOSIS — H3561 Retinal hemorrhage, right eye: Secondary | ICD-10-CM | POA: Diagnosis not present

## 2020-07-26 DIAGNOSIS — R972 Elevated prostate specific antigen [PSA]: Secondary | ICD-10-CM | POA: Diagnosis not present

## 2020-08-02 DIAGNOSIS — R972 Elevated prostate specific antigen [PSA]: Secondary | ICD-10-CM | POA: Diagnosis not present

## 2020-08-02 DIAGNOSIS — N5201 Erectile dysfunction due to arterial insufficiency: Secondary | ICD-10-CM | POA: Diagnosis not present

## 2020-10-06 DIAGNOSIS — Z4420 Encounter for fitting and adjustment of artificial eye, unspecified: Secondary | ICD-10-CM | POA: Diagnosis not present

## 2021-02-02 DIAGNOSIS — G4733 Obstructive sleep apnea (adult) (pediatric): Secondary | ICD-10-CM | POA: Diagnosis not present

## 2021-02-02 DIAGNOSIS — Z1339 Encounter for screening examination for other mental health and behavioral disorders: Secondary | ICD-10-CM | POA: Diagnosis not present

## 2021-02-02 DIAGNOSIS — M5416 Radiculopathy, lumbar region: Secondary | ICD-10-CM | POA: Diagnosis not present

## 2021-02-02 DIAGNOSIS — R7301 Impaired fasting glucose: Secondary | ICD-10-CM | POA: Diagnosis not present

## 2021-02-02 DIAGNOSIS — Z87438 Personal history of other diseases of male genital organs: Secondary | ICD-10-CM | POA: Diagnosis not present

## 2021-02-02 DIAGNOSIS — R972 Elevated prostate specific antigen [PSA]: Secondary | ICD-10-CM | POA: Diagnosis not present

## 2021-02-02 DIAGNOSIS — I10 Essential (primary) hypertension: Secondary | ICD-10-CM | POA: Diagnosis not present

## 2021-02-02 DIAGNOSIS — Z0000 Encounter for general adult medical examination without abnormal findings: Secondary | ICD-10-CM | POA: Diagnosis not present

## 2021-02-02 DIAGNOSIS — E785 Hyperlipidemia, unspecified: Secondary | ICD-10-CM | POA: Diagnosis not present

## 2021-02-02 DIAGNOSIS — Z1331 Encounter for screening for depression: Secondary | ICD-10-CM | POA: Diagnosis not present

## 2021-02-02 DIAGNOSIS — G629 Polyneuropathy, unspecified: Secondary | ICD-10-CM | POA: Diagnosis not present

## 2021-02-26 DIAGNOSIS — H0100A Unspecified blepharitis right eye, upper and lower eyelids: Secondary | ICD-10-CM | POA: Diagnosis not present

## 2021-02-26 DIAGNOSIS — H524 Presbyopia: Secondary | ICD-10-CM | POA: Diagnosis not present

## 2021-02-26 DIAGNOSIS — H35031 Hypertensive retinopathy, right eye: Secondary | ICD-10-CM | POA: Diagnosis not present

## 2021-02-26 DIAGNOSIS — H0100B Unspecified blepharitis left eye, upper and lower eyelids: Secondary | ICD-10-CM | POA: Diagnosis not present

## 2021-03-17 DIAGNOSIS — Z23 Encounter for immunization: Secondary | ICD-10-CM | POA: Diagnosis not present

## 2021-03-23 DIAGNOSIS — Z4420 Encounter for fitting and adjustment of artificial eye, unspecified: Secondary | ICD-10-CM | POA: Diagnosis not present

## 2021-04-23 ENCOUNTER — Encounter: Payer: Self-pay | Admitting: Family Medicine

## 2021-04-23 ENCOUNTER — Ambulatory Visit: Payer: Self-pay

## 2021-04-23 ENCOUNTER — Ambulatory Visit: Payer: Medicare HMO | Admitting: Family Medicine

## 2021-04-23 ENCOUNTER — Other Ambulatory Visit: Payer: Self-pay

## 2021-04-23 VITALS — BP 130/82 | Ht 69.0 in | Wt 170.0 lb

## 2021-04-23 DIAGNOSIS — G8929 Other chronic pain: Secondary | ICD-10-CM | POA: Diagnosis not present

## 2021-04-23 DIAGNOSIS — M25511 Pain in right shoulder: Secondary | ICD-10-CM

## 2021-04-23 DIAGNOSIS — M25522 Pain in left elbow: Secondary | ICD-10-CM | POA: Diagnosis not present

## 2021-04-23 MED ORDER — NITROGLYCERIN 0.2 MG/HR TD PT24: MEDICATED_PATCH | TRANSDERMAL | 5 refills | Status: AC

## 2021-04-23 NOTE — Patient Instructions (Addendum)
You have proximal biceps strain/tendinopathy. Continue home exercises focusing on the biceps but only do these once a day. Nitro patches 1/4th patch to affected area, change daily. Consider topical voltaren gel up to 4 times a day. Ice or heat, whichever feels better 15 minutes at a time 3-4 times a day.  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 Avoid painful activities as much as possible (unless doing home exercises). Tylenol, voltaren gel if needed. Icing 3-4 times a day - careful around ulnar nerve on inside of elbow. Strengthening with 1 pound weight pronation/supination, wrist flexion, stretching exercises (hold stretches 20-30 seconds and repeat 3 times, exercises 3 sets of 10 of each). Sleeve or counterforce brace may be helpful to unload area of pain while it heals. Consider formal physical therapy, nitro patches, injection if not improving. If you do the nitro patches wait about a week before also applying these to the elbow. Follow up with me in 6 weeks.

## 2021-04-23 NOTE — Progress Notes (Signed)
PCP: Cleatis Polka., MD  Subjective:   HPI: Patient is a 75 y.o. male here for right shoulder pain, left elbow pain.  Patient reports prior history of right shoulder rotator cuff tear back in 2015 though this healed without any sequelae. Current pain in right shoulder started about 2-3 weeks ago. He has been playing more golf of late but does not recall an acute injury. Noticed pain anterior right shoulder after playing though. + night pain. No swelling or bruising. Has tried aleve and for the past 2-3 days has started to use nitro patches again. Also reports medial left elbow pain - asking about treatment for medial epicondylitis.  Past Medical History:  Diagnosis Date   HTN (hypertension)    Hyperlipidemia     Current Outpatient Medications on File Prior to Visit  Medication Sig Dispense Refill   aspirin 81 MG tablet Take 160 mg by mouth. 2-3 x weekly     atorvastatin (LIPITOR) 20 MG tablet 1 po QD as directed (Patient taking differently: Take every 5th day) 90 tablet 3   irbesartan (AVAPRO) 300 MG tablet TAKE 1 TABLET(300 MG) BY MOUTH DAILY 90 tablet 3   naproxen sodium (ANAPROX) 220 MG tablet Take 220 mg by mouth 2 (two) times daily with a meal. Reported on 06/15/2015     No current facility-administered medications on file prior to visit.    Past Surgical History:  Procedure Laterality Date   CATARACT EXTRACTION     OD   COLONOSCOPY  2000 & 2013   Negative,Dr Medoff   opthalmologic prosthesis     OS @ 6 (post trauma)    Allergies  Allergen Reactions   Lisinopril Other (See Comments)    Cough    Sulfonamide Derivatives Rash    BP 130/82   Ht 5\' 9"  (1.753 m)   Wt 170 lb (77.1 kg)   BMI 25.10 kg/m   No flowsheet data found.  No flowsheet data found.      Objective:  Physical Exam:  Gen: NAD, comfortable in exam room  Right shoulder: No swelling, ecchymoses.  No gross deformity. TTP over biceps tendon.  No AC, other tenderness. FROM. Negative  Hawkins, positive Neers. Positive Yergasons. Strength 5/5 with empty can and resisted internal/external rotation.  Pain resisted elbow flexion at anterior shoulder. Negative apprehension. NV intact distally.  Left elbow: No deformity. FROM with 5/5 strength.  Pain resisted pronation, wrist flexion. Tenderness to palpation medial epicondyle. NVI distally.  Complete MSK u/s right shoulder: Biceps tendon: some hypoechoic change proximally consistent with tendinopathy.  More distally at proximal musculotendinous junction some swelling present without rupture. Pec major tendon: intact Subscapularis: intact without abnormalities AC joint: small geyser sign with mild arthropathy Infraspinatus: intact without abnormalities Supraspinatus: small interstitial tear measuring 76mm in width with 75mm retraction appears similar to prior scan from 2015.  No bursitis, new tears visualized. Posterior glenohumeral joint: no effusion, labral abnormalities.  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   Assessment & Plan:  1. Right shoulder pain - 2/2 proximal biceps tendinopathy/strain.  Home exercises, nitro patches, consider voltaren gel.  Ice or heat.  Consider formal physical therapy.  2. Left medial epicondylitis - using counterforce brace - continue to do so.  Tylenol, voltaren gel, ice or heat.  Home exercises reviewed.  Can add nitro for this also.  F/u in 6 weeks.

## 2021-04-25 NOTE — Telephone Encounter (Signed)
Ok to refer Arianna's wife Darl Pikes (DOB in this note) to [facility] or [facility] on [address] for neck pain.  Please let them know!  Thanks!

## 2021-06-14 ENCOUNTER — Ambulatory Visit: Payer: Medicare HMO | Admitting: Sports Medicine

## 2021-06-14 DIAGNOSIS — S46011D Strain of muscle(s) and tendon(s) of the rotator cuff of right shoulder, subsequent encounter: Secondary | ICD-10-CM | POA: Diagnosis not present

## 2021-06-14 DIAGNOSIS — M25511 Pain in right shoulder: Secondary | ICD-10-CM | POA: Diagnosis not present

## 2021-06-14 NOTE — Assessment & Plan Note (Signed)
He has return of his right shoulder pain although this time it seems primarily related to the bicipital tendon  We discussed options and will try to increase the frequency him of his biceps exercises Had some internal and external rotation strength exercises Increase his nitroglycerin patch to one half patch Give this an additional 6 weeks If he is not improved we would consider trying ESWT

## 2021-06-14 NOTE — Progress Notes (Signed)
Chief complaint continued right shoulder pain  Patient seen 6 weeks ago with right shoulder pain by Dr. Pearletha Forge He had a history of rotator cuff tear that was partial-thickness and only retracted about 6 mm in 2015 Symptoms resolved with home exercise nitroglycerin for that injury and he had done well until this started becoming painful around 10 April 2021   Exam on that visit showed pain on biceps testing and some positive impingement signs Ultrasound on that visit showed the old intrasubstance supraspinatus tear that had really not changed since 2015 He did have some AC joint arthritis with a small effusion Biceps tendon did show swelling around the tendon and the proximal areas  He started on nitroglycerin patches He has been doing intermittently some biceps rehabilitation exercises Pain is now primarily with forward flexion and elevation of the arm over his head He does wake up in the morning with fairly painful and if he takes Aleve most the pain resolves in 2 to 3 hours Symptoms are only marginally better than they were 6 weeks ago  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

## 2021-06-14 NOTE — Assessment & Plan Note (Signed)
On ultrasound November 2022 the rotator cuff tear had not completely resolved but was retracted only about 6 mm Considering his testing today seems unremarkable for the rotator cuff I do not think this represents a worsening of his chronic tear

## 2021-07-09 DIAGNOSIS — R972 Elevated prostate specific antigen [PSA]: Secondary | ICD-10-CM | POA: Diagnosis not present

## 2021-07-10 ENCOUNTER — Encounter (INDEPENDENT_AMBULATORY_CARE_PROVIDER_SITE_OTHER): Payer: Medicare HMO | Admitting: Ophthalmology

## 2021-07-13 ENCOUNTER — Ambulatory Visit (INDEPENDENT_AMBULATORY_CARE_PROVIDER_SITE_OTHER): Payer: Self-pay | Admitting: Sports Medicine

## 2021-07-13 VITALS — Ht 68.0 in

## 2021-07-13 DIAGNOSIS — R972 Elevated prostate specific antigen [PSA]: Secondary | ICD-10-CM | POA: Diagnosis not present

## 2021-07-13 DIAGNOSIS — M25511 Pain in right shoulder: Secondary | ICD-10-CM

## 2021-07-13 DIAGNOSIS — G8929 Other chronic pain: Secondary | ICD-10-CM

## 2021-07-13 DIAGNOSIS — N5201 Erectile dysfunction due to arterial insufficiency: Secondary | ICD-10-CM | POA: Diagnosis not present

## 2021-07-13 DIAGNOSIS — M67921 Unspecified disorder of synovium and tendon, right upper arm: Secondary | ICD-10-CM

## 2021-07-13 NOTE — Progress Notes (Signed)
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  Procedure: ECSWT Indications: Chronic Right shoulder pain; biceps tendinopathy   Procedure Details Consent: Risks of procedure as well as the alternatives and risks of each were explained to the patient.  Written consent for procedure obtained. Time Out: Verified patient identification, verified procedure, site was marked, verified correct patient position, medications/allergies/relevent history reviewed.  The area was cleaned with alcohol swab.     The Right shoulder (biceps tendon, supraspinatus tendon) was targeted for Extracorporeal shockwave therapy.    Preset: Tendinopathy of the shoulder/shoulder problems Power Level: 90 mJ Frequency: 8 Hz Impulse/cycles: 1800 Head size: Regular   Patient tolerated procedure well without immediate complications. He will see how the pain responds to the treatment, plan for repeat treatment next week.  Depending on the results of the shockwave, he may consider weekly/biweekly treatments for a handful of sessions.  Madelyn Brunner, DO PGY-4, Sports Medicine Fellow [facility]  Addendum:  I was the preceptor for this visit and available for immediate consultation.  Norton Blizzard MD Marrianne Mood

## 2021-07-20 ENCOUNTER — Ambulatory Visit (INDEPENDENT_AMBULATORY_CARE_PROVIDER_SITE_OTHER): Payer: Self-pay | Admitting: Sports Medicine

## 2021-07-20 DIAGNOSIS — M25511 Pain in right shoulder: Secondary | ICD-10-CM

## 2021-07-20 DIAGNOSIS — M67921 Unspecified disorder of synovium and tendon, right upper arm: Secondary | ICD-10-CM

## 2021-07-20 DIAGNOSIS — G8929 Other chronic pain: Secondary | ICD-10-CM

## 2021-07-20 NOTE — Progress Notes (Signed)
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 He was very pleased after his first session, reports receiving about 90-95% improvement in pain and improved function. Was able to play golf twice this week without difficulty.  Procedure: ECSWT Indications:  Chronic Right shoulder pain; biceps tendinopathy   Procedure Details Consent: Risks of procedure as well as the alternatives and risks of each were explained to the patient.  Written consent for procedure obtained. Time Out: Verified patient identification, verified procedure, site was marked, verified correct patient position, medications/allergies/relevent history reviewed.  The area was cleaned with alcohol swab.     The Right shoulder (biceps tendon, supraspinatus tendon) was targeted for Extracorporeal shockwave therapy.    Preset: Tendinopathy of the shoulder/shoulder problems Power Level: 90 mJ Frequency: 9 Hz Impulse/cycles: 1800 Head size: Regular   Patient tolerated procedure well without immediate complications. He had some mild bruising following session #1, but no pain. Discussed trial of somewhere between 3-5 sessions if continues to find improvement. Follow-up in a week for session #3, then go PRN from there if patient desires.  Madelyn Brunner, DO PGY-4, Sports Medicine Fellow [facility]

## 2021-08-16 ENCOUNTER — Telehealth: Payer: Self-pay | Admitting: Dermatology

## 2021-08-16 ENCOUNTER — Ambulatory Visit: Payer: Medicare HMO | Admitting: Sports Medicine

## 2021-08-16 VITALS — BP 129/77 | Ht 68.0 in | Wt 167.0 lb

## 2021-08-16 DIAGNOSIS — M25511 Pain in right shoulder: Secondary | ICD-10-CM | POA: Diagnosis not present

## 2021-08-16 DIAGNOSIS — G8929 Other chronic pain: Secondary | ICD-10-CM

## 2021-08-16 DIAGNOSIS — M67921 Unspecified disorder of synovium and tendon, right upper arm: Secondary | ICD-10-CM | POA: Diagnosis not present

## 2021-08-16 NOTE — Assessment & Plan Note (Signed)
Overall he is happy with the progress he has made.  Continue home exercise program and can come back as needed for shockwave therapy if he thinks that this was helpful.  Otherwise can follow-up as needed. ?

## 2021-08-16 NOTE — Telephone Encounter (Signed)
She says her husband is a doctor and a patient and wants to talk to ST about her rash. ?

## 2021-08-16 NOTE — Progress Notes (Signed)
? ?  Jason Key is a 76 y.o. male who presents to [facility] today for the following: ? ?Chronic Right Shoulder Pain ?With Biceps tendinopathy ?Has received 2 shockwave therapies with significant improvement ?Last was 2/10 ?Did very well with this ?Hasn't been doing HEP diligently, but occasionally ?Overall has golfed and done okay with this ?Doesn't have much pain at rest or with normal daily activities  ?About 80-85% improved ? ?PMH reviewed.  ?ROS as above. ?Medications reviewed. ? ?Exam:  ?BP 129/77   Ht 5\' 8"  (1.727 m)   Wt 167 lb (75.8 kg)   BMI 25.39 kg/m?  ?Gen: Well NAD ?MSK: ? ?Right Shoulder: ?Inspection reveals no obvious deformity, atrophy, or asymmetry b/l. No bruising. No swelling ?Palpation is normal with no TTP over [facility] joint or bicipital groove b/l. ?Full ROM in forwards flexion ?NV intact distally b/l ?Special Tests:  ?- Biceps tendon: Negative Speeds ?- Labrum: good stability ?- No painful arc and no drop arm sign  ? ? ?No results found. ? ? ?Assessment and Plan: ?1) Chronic right shoulder pain ?Overall he is happy with the progress he has made.  Continue home exercise program and can come back as needed for shockwave therapy if he thinks that this was helpful.  Otherwise can follow-up as needed. ? ? ?Ari[place] Constable, D.O.  ?PGY-4 [place]  ?08/16/2021 2:09 PM ? ?I observed and examined the patient with the [facility] resident and agree with assessment and plan.  Note reviewed and modified by me. ?Ila Mcgill, MD ?

## 2021-09-24 DIAGNOSIS — H0011 Chalazion right upper eyelid: Secondary | ICD-10-CM | POA: Diagnosis not present

## 2021-10-05 DIAGNOSIS — Z4420 Encounter for fitting and adjustment of artificial eye, unspecified: Secondary | ICD-10-CM | POA: Diagnosis not present

## 2021-10-15 ENCOUNTER — Ambulatory Visit: Payer: Medicare HMO | Admitting: Podiatry

## 2021-10-15 ENCOUNTER — Ambulatory Visit (INDEPENDENT_AMBULATORY_CARE_PROVIDER_SITE_OTHER): Payer: Medicare HMO

## 2021-10-15 ENCOUNTER — Encounter: Payer: Self-pay | Admitting: Podiatry

## 2021-10-15 DIAGNOSIS — M778 Other enthesopathies, not elsewhere classified: Secondary | ICD-10-CM

## 2021-10-15 DIAGNOSIS — M722 Plantar fascial fibromatosis: Secondary | ICD-10-CM | POA: Diagnosis not present

## 2021-10-15 MED ORDER — TRIAMCINOLONE ACETONIDE 10 MG/ML IJ SUSP
10.0000 mg | Freq: Once | INTRAMUSCULAR | Status: AC
  Administered 2021-10-15: 10 mg

## 2021-10-15 NOTE — Progress Notes (Signed)
Subjective:  ? ?Patient ID: Jason Key, male   DOB: 76 y.o.   MRN: [id number]  ? ?HPI ?Patient states he had a lot of pain in his right instep and its been present for several months and the only comfortable shoes crocs.  Patient likes to be active and this is hindering him and he may be developing some compensatory dorsal or lateral foot pain.  Patient does not smoke likes to be active ? ? ?Review of Systems  ?All other systems reviewed and are negative. ? ? ?   ?Objective:  ?Physical Exam ?Vitals and nursing note reviewed.  ?Constitutional:   ?   Appearance: He is well-developed.  ?Pulmonary:  ?   Effort: Pulmonary effort is normal.  ?Musculoskeletal:     ?   General: Normal range of motion.  ?Skin: ?   General: Skin is warm.  ?Neurological:  ?   Mental Status: He is alert.  ?  ?Neurovascular status intact muscle strength found to be adequate range of motion adequate no indications of foot drop with good anterior tibial function.  Patient has moderate discomfort underneath the mid arch right fascia medial band with no current discomfort dorsally or laterally but seems to occur more after activity.  Patient has good digital perfusion well oriented x3 ? ?   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 ?   ?Plan:  ?H&P x-ray reviewed condition discussed and at this point I did do a midfoot injection 3 mg Kenalog 5 mg Xylocaine and then went ahead applied fascial brace to lift up the arch and take tension off the heel.  Patient will be seen back as needed and hopefully will not require other treatments ultimately could require immobilization or other types of modalities ? ?X-rays were negative for signs of changes in the arch with moderate cavus foot structure noted no signs of arthritis ?   ? ? ?

## 2021-10-15 NOTE — Patient Instructions (Signed)

## 2021-12-03 ENCOUNTER — Ambulatory Visit (INDEPENDENT_AMBULATORY_CARE_PROVIDER_SITE_OTHER): Payer: Self-pay | Admitting: Family Medicine

## 2021-12-03 VITALS — BP 138/82 | Ht 68.0 in | Wt 168.0 lb

## 2021-12-03 DIAGNOSIS — M67921 Unspecified disorder of synovium and tendon, right upper arm: Secondary | ICD-10-CM

## 2021-12-03 NOTE — Progress Notes (Signed)
Patient returns today for shockwave therapy to his right proximal biceps tendon.  He worked out in past week including lat pull-downs and pushups and feels this aggravated his prior issue.  Was doing extremely well prior to this.  Yergasons, speeds positive on exam.  Negative rotator cuff testing. Would like a third shockwave treatment.  Procedure: ECSWT Indications:  right proximal biceps tendinopathy   Procedure Details Consent: Risks of procedure as well as the alternatives and risks of each were explained to the patient.  Written consent for procedure obtained. Time Out: Verified patient identification, verified procedure, site was marked, verified correct patient position, medications/allergies/relevent history reviewed.  The area was cleaned with alcohol swab.     The right proximal biceps tendon was targeted for Extracorporeal shockwave therapy.    Preset: tendinopathy of the shoulder/shoulder problems Power Level: 90 Frequency: 10 Impulse/cycles: 2000 Head size: large   Patient tolerated procedure well without immediate complications.  He will follow up as needed.

## 2022-01-14 ENCOUNTER — Ambulatory Visit (INDEPENDENT_AMBULATORY_CARE_PROVIDER_SITE_OTHER): Payer: Self-pay | Admitting: Family Medicine

## 2022-01-14 ENCOUNTER — Encounter: Payer: Self-pay | Admitting: Family Medicine

## 2022-01-14 VITALS — Ht 68.0 in

## 2022-01-14 DIAGNOSIS — M67921 Unspecified disorder of synovium and tendon, right upper arm: Secondary | ICD-10-CM

## 2022-01-14 NOTE — Progress Notes (Signed)
Patient returns for right shoulder shockwave therapy for right proximal biceps tendon.  Had treatment about 6 weeks ago and continues to struggle with anterior right shoulder pain.  No new injuries.  Keeping him up at night.  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  Procedure: ECSWT Indications:  right proximal biceps tendinopathy   Procedure Details Consent: Risks of procedure as well as the alternatives and risks of each were explained to the patient.  Written consent for procedure obtained. Time Out: Verified patient identification, verified procedure, site was marked, verified correct patient position, medications/allergies/relevent history reviewed.  The area was cleaned with alcohol swab.     The right proximal biceps tendon was targeted for Extracorporeal shockwave therapy.    Preset: tendinopathy of the shoulder Power Level: 90 Frequency: 9 Impulse/cycles: 1800 Head size: large   Patient tolerated procedure well without immediate complications.  Discussed consideration of steroid dose pack, additional shockwave treatments.

## 2022-01-29 ENCOUNTER — Encounter: Payer: Self-pay | Admitting: Family Medicine

## 2022-01-29 MED ORDER — PREDNISONE 10 MG PO TABS: ORAL_TABLET | ORAL | 0 refills | Status: AC

## 2022-02-14 DIAGNOSIS — G629 Polyneuropathy, unspecified: Secondary | ICD-10-CM | POA: Diagnosis not present

## 2022-02-14 DIAGNOSIS — I10 Essential (primary) hypertension: Secondary | ICD-10-CM | POA: Diagnosis not present

## 2022-02-14 DIAGNOSIS — Z0000 Encounter for general adult medical examination without abnormal findings: Secondary | ICD-10-CM | POA: Diagnosis not present

## 2022-02-14 DIAGNOSIS — R7301 Impaired fasting glucose: Secondary | ICD-10-CM | POA: Diagnosis not present

## 2022-02-14 DIAGNOSIS — G4700 Insomnia, unspecified: Secondary | ICD-10-CM | POA: Diagnosis not present

## 2022-02-14 DIAGNOSIS — G4733 Obstructive sleep apnea (adult) (pediatric): Secondary | ICD-10-CM | POA: Diagnosis not present

## 2022-02-14 DIAGNOSIS — Z1331 Encounter for screening for depression: Secondary | ICD-10-CM | POA: Diagnosis not present

## 2022-02-14 DIAGNOSIS — M5416 Radiculopathy, lumbar region: Secondary | ICD-10-CM | POA: Diagnosis not present

## 2022-02-14 DIAGNOSIS — Z1389 Encounter for screening for other disorder: Secondary | ICD-10-CM | POA: Diagnosis not present

## 2022-02-14 DIAGNOSIS — Z87438 Personal history of other diseases of male genital organs: Secondary | ICD-10-CM | POA: Diagnosis not present

## 2022-02-14 DIAGNOSIS — E785 Hyperlipidemia, unspecified: Secondary | ICD-10-CM | POA: Diagnosis not present

## 2022-02-14 DIAGNOSIS — R972 Elevated prostate specific antigen [PSA]: Secondary | ICD-10-CM | POA: Diagnosis not present

## 2022-05-27 DIAGNOSIS — Z4420 Encounter for fitting and adjustment of artificial eye, unspecified: Secondary | ICD-10-CM | POA: Diagnosis not present

## 2022-06-29 DIAGNOSIS — R059 Cough, unspecified: Secondary | ICD-10-CM | POA: Diagnosis not present

## 2022-06-29 DIAGNOSIS — Z20822 Contact with and (suspected) exposure to covid-19: Secondary | ICD-10-CM | POA: Diagnosis not present

## 2022-06-30 DIAGNOSIS — R059 Cough, unspecified: Secondary | ICD-10-CM | POA: Diagnosis not present

## 2022-06-30 DIAGNOSIS — Z20822 Contact with and (suspected) exposure to covid-19: Secondary | ICD-10-CM | POA: Diagnosis not present

## 2022-07-16 DIAGNOSIS — Z20822 Contact with and (suspected) exposure to covid-19: Secondary | ICD-10-CM | POA: Diagnosis not present

## 2022-07-16 DIAGNOSIS — R059 Cough, unspecified: Secondary | ICD-10-CM | POA: Diagnosis not present

## 2022-07-17 DIAGNOSIS — R059 Cough, unspecified: Secondary | ICD-10-CM | POA: Diagnosis not present

## 2022-07-17 DIAGNOSIS — Z20822 Contact with and (suspected) exposure to covid-19: Secondary | ICD-10-CM | POA: Diagnosis not present

## 2022-07-18 DIAGNOSIS — R059 Cough, unspecified: Secondary | ICD-10-CM | POA: Diagnosis not present

## 2022-07-18 DIAGNOSIS — Z20822 Contact with and (suspected) exposure to covid-19: Secondary | ICD-10-CM | POA: Diagnosis not present

## 2022-07-20 DIAGNOSIS — R059 Cough, unspecified: Secondary | ICD-10-CM | POA: Diagnosis not present

## 2022-07-20 DIAGNOSIS — Z20822 Contact with and (suspected) exposure to covid-19: Secondary | ICD-10-CM | POA: Diagnosis not present

## 2022-07-21 DIAGNOSIS — R059 Cough, unspecified: Secondary | ICD-10-CM | POA: Diagnosis not present

## 2022-07-21 DIAGNOSIS — Z20822 Contact with and (suspected) exposure to covid-19: Secondary | ICD-10-CM | POA: Diagnosis not present

## 2022-07-24 DIAGNOSIS — R059 Cough, unspecified: Secondary | ICD-10-CM | POA: Diagnosis not present

## 2022-07-24 DIAGNOSIS — Z20822 Contact with and (suspected) exposure to covid-19: Secondary | ICD-10-CM | POA: Diagnosis not present

## 2022-07-27 DIAGNOSIS — R059 Cough, unspecified: Secondary | ICD-10-CM | POA: Diagnosis not present

## 2022-07-27 DIAGNOSIS — Z20822 Contact with and (suspected) exposure to covid-19: Secondary | ICD-10-CM | POA: Diagnosis not present

## 2023-05-19 DIAGNOSIS — Z809 Family history of malignant neoplasm, unspecified: Secondary | ICD-10-CM | POA: Diagnosis not present

## 2023-05-19 DIAGNOSIS — G4733 Obstructive sleep apnea (adult) (pediatric): Secondary | ICD-10-CM | POA: Diagnosis not present

## 2023-05-19 DIAGNOSIS — E785 Hyperlipidemia, unspecified: Secondary | ICD-10-CM | POA: Diagnosis not present

## 2023-05-19 DIAGNOSIS — I10 Essential (primary) hypertension: Secondary | ICD-10-CM | POA: Diagnosis not present

## 2023-05-19 DIAGNOSIS — Z882 Allergy status to sulfonamides status: Secondary | ICD-10-CM | POA: Diagnosis not present

## 2023-05-19 DIAGNOSIS — Z791 Long term (current) use of non-steroidal anti-inflammatories (NSAID): Secondary | ICD-10-CM | POA: Diagnosis not present

## 2023-05-19 DIAGNOSIS — Z8249 Family history of ischemic heart disease and other diseases of the circulatory system: Secondary | ICD-10-CM | POA: Diagnosis not present

## 2023-07-23 DIAGNOSIS — M720 Palmar fascial fibromatosis [Dupuytren]: Secondary | ICD-10-CM | POA: Diagnosis not present

## 2023-12-25 DIAGNOSIS — Z7689 Persons encountering health services in other specified circumstances: Secondary | ICD-10-CM | POA: Diagnosis not present

## 2024-01-19 DIAGNOSIS — N390 Urinary tract infection, site not specified: Secondary | ICD-10-CM | POA: Diagnosis not present

## 2024-01-28 DIAGNOSIS — R82998 Other abnormal findings in urine: Secondary | ICD-10-CM | POA: Diagnosis not present

## 2024-04-14 DIAGNOSIS — M720 Palmar fascial fibromatosis [Dupuytren]: Secondary | ICD-10-CM | POA: Diagnosis not present

## 2024-04-14 DIAGNOSIS — M79642 Pain in left hand: Secondary | ICD-10-CM | POA: Diagnosis not present
